# Patient Record
Sex: Female | Born: 1985 | Hispanic: Yes | Marital: Single | Smoking: Former smoker
Health system: Southern US, Community
[De-identification: ages and names within clinical notes are randomized; demographics above are authoritative.]

## PROBLEM LIST (undated history)

## (undated) DIAGNOSIS — M419 Scoliosis, unspecified: Secondary | ICD-10-CM

## (undated) HISTORY — PX: CHOLECYSTECTOMY: SHX55

---

## 2013-12-09 ENCOUNTER — Emergency Department: Payer: Self-pay | Admitting: Emergency Medicine

## 2015-11-09 ENCOUNTER — Emergency Department: Payer: No Typology Code available for payment source

## 2015-11-09 ENCOUNTER — Encounter: Payer: Self-pay | Admitting: Emergency Medicine

## 2015-11-09 ENCOUNTER — Emergency Department
Admission: EM | Admit: 2015-11-09 | Discharge: 2015-11-10 | Disposition: A | Discharge status: Home / Self Care | Payer: No Typology Code available for payment source | Attending: Emergency Medicine | Admitting: Emergency Medicine

## 2015-11-09 DIAGNOSIS — Z3202 Encounter for pregnancy test, result negative: Secondary | ICD-10-CM | POA: Diagnosis not present

## 2015-11-09 DIAGNOSIS — F17200 Nicotine dependence, unspecified, uncomplicated: Secondary | ICD-10-CM | POA: Insufficient documentation

## 2015-11-09 DIAGNOSIS — S161XXA Strain of muscle, fascia and tendon at neck level, initial encounter: Secondary | ICD-10-CM | POA: Insufficient documentation

## 2015-11-09 DIAGNOSIS — Y9389 Activity, other specified: Secondary | ICD-10-CM | POA: Diagnosis not present

## 2015-11-09 DIAGNOSIS — S39012A Strain of muscle, fascia and tendon of lower back, initial encounter: Secondary | ICD-10-CM

## 2015-11-09 DIAGNOSIS — Y92410 Unspecified street and highway as the place of occurrence of the external cause: Secondary | ICD-10-CM | POA: Diagnosis not present

## 2015-11-09 DIAGNOSIS — Y998 Other external cause status: Secondary | ICD-10-CM | POA: Diagnosis not present

## 2015-11-09 DIAGNOSIS — S199XXA Unspecified injury of neck, initial encounter: Secondary | ICD-10-CM | POA: Diagnosis present

## 2015-11-09 HISTORY — DX: Scoliosis, unspecified: M41.9

## 2015-11-09 LAB — POCT PREGNANCY, URINE: PREG TEST UR: NEGATIVE

## 2015-11-09 MED ORDER — OXYCODONE-ACETAMINOPHEN 7.5-325 MG PO TABS: 1.0000 | ORAL_TABLET | ORAL | Status: DC | PRN

## 2015-11-09 MED ORDER — TRAMADOL HCL 50 MG PO TABS: 50.0000 mg | ORAL_TABLET | Freq: Four times a day (QID) | ORAL | Status: DC | PRN

## 2015-11-09 MED ORDER — METHOCARBAMOL 750 MG PO TABS: 750.0000 mg | ORAL_TABLET | Freq: Four times a day (QID) | ORAL | Status: DC

## 2015-11-09 MED ORDER — CYCLOBENZAPRINE HCL 10 MG PO TABS: 10.0000 mg | ORAL_TABLET | Freq: Three times a day (TID) | ORAL | Status: DC | PRN

## 2015-11-09 MED ORDER — TRAMADOL HCL 50 MG PO TABS
50.0000 mg | ORAL_TABLET | Freq: Once | ORAL | Status: AC
  Administered 2015-11-09: 50 mg via ORAL
  Filled 2015-11-09: qty 1

## 2015-11-09 MED ORDER — IBUPROFEN 800 MG PO TABS: 800.0000 mg | ORAL_TABLET | Freq: Three times a day (TID) | ORAL | Status: DC | PRN

## 2015-11-09 MED ORDER — METHOCARBAMOL 500 MG PO TABS
1000.0000 mg | ORAL_TABLET | Freq: Once | ORAL | Status: AC
  Administered 2015-11-09: 1000 mg via ORAL
  Filled 2015-11-09: qty 2

## 2015-11-09 MED ORDER — IBUPROFEN 600 MG PO TABS: 600.0000 mg | ORAL_TABLET | Freq: Four times a day (QID) | ORAL | Status: DC | PRN
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
## 2015-11-09 NOTE — Discharge Instructions (Signed)

## 2015-11-10 MED ORDER — HYDROMORPHONE HCL 1 MG/ML IJ SOLN
INTRAMUSCULAR | Status: AC
  Filled 2015-11-10: qty 1

## 2015-11-10 MED ORDER — HYDROMORPHONE HCL 1 MG/ML IJ SOLN
1.0000 mg | Freq: Once | INTRAMUSCULAR | Status: AC
  Administered 2015-11-10: 1 mg via INTRAMUSCULAR

## 2015-12-07 ENCOUNTER — Encounter: Payer: Self-pay | Admitting: Emergency Medicine

## 2015-12-07 ENCOUNTER — Emergency Department
Admission: EM | Admit: 2015-12-07 | Discharge: 2015-12-07 | Disposition: A | Discharge status: Home / Self Care | Payer: Self-pay | Attending: Student | Admitting: Student

## 2015-12-07 ENCOUNTER — Emergency Department: Payer: Self-pay

## 2015-12-07 DIAGNOSIS — M419 Scoliosis, unspecified: Secondary | ICD-10-CM | POA: Insufficient documentation

## 2015-12-07 DIAGNOSIS — K529 Noninfective gastroenteritis and colitis, unspecified: Secondary | ICD-10-CM

## 2015-12-07 DIAGNOSIS — K5180 Other ulcerative colitis without complications: Secondary | ICD-10-CM | POA: Insufficient documentation

## 2015-12-07 DIAGNOSIS — Z720 Tobacco use: Secondary | ICD-10-CM | POA: Insufficient documentation

## 2015-12-07 DIAGNOSIS — R109 Unspecified abdominal pain: Secondary | ICD-10-CM

## 2015-12-07 LAB — COMPREHENSIVE METABOLIC PANEL
ALBUMIN: 3.8 g/dL (ref 3.5–5.0)
ALK PHOS: 66 U/L (ref 38–126)
ALT: 12 U/L — ABNORMAL LOW (ref 14–54)
AST: 14 U/L — AB (ref 15–41)
Anion gap: 7 (ref 5–15)
BILIRUBIN TOTAL: 0.5 mg/dL (ref 0.3–1.2)
BUN: 7 mg/dL (ref 6–20)
CALCIUM: 8.8 mg/dL — AB (ref 8.9–10.3)
CO2: 27 mmol/L (ref 22–32)
CREATININE: 0.77 mg/dL (ref 0.44–1.00)
Chloride: 100 mmol/L — ABNORMAL LOW (ref 101–111)
GFR calc Af Amer: >60 mL/min (ref >60)
GFR calc non Af Amer: >60 mL/min (ref >60)
GLUCOSE: 107 mg/dL — AB (ref 65–99)
Potassium: 3.8 mmol/L (ref 3.5–5.1)
Sodium: 134 mmol/L — ABNORMAL LOW (ref 135–145)
TOTAL PROTEIN: 7.7 g/dL (ref 6.5–8.1)

## 2015-12-07 LAB — CBC
HCT: 40.5 % (ref 35.0–47.0)
Hemoglobin: 13.5 g/dL (ref 12.0–16.0)
MCH: 30.1 pg (ref 26.0–34.0)
MCHC: 33.3 g/dL (ref 32.0–36.0)
MCV: 90.3 fL (ref 80.0–100.0)
PLATELETS: 287 10*3/uL (ref 150–440)
RBC: 4.49 MIL/uL (ref 3.80–5.20)
RDW: 12.8 % (ref 11.5–14.5)
WBC: 13.3 10*3/uL — ABNORMAL HIGH (ref 3.6–11.0)

## 2015-12-07 LAB — URINALYSIS COMPLETE WITH MICROSCOPIC (ARMC ONLY)
BACTERIA UA: NONE SEEN
BILIRUBIN URINE: NEGATIVE
GLUCOSE, UA: NEGATIVE mg/dL
Hgb urine dipstick: NEGATIVE
KETONES UR: NEGATIVE mg/dL
Leukocytes, UA: NEGATIVE
NITRITE: NEGATIVE
Protein, ur: NEGATIVE mg/dL
SPECIFIC GRAVITY, URINE: 1.018 (ref 1.005–1.030)
pH: 6 (ref 5.0–8.0)

## 2015-12-07 LAB — LIPASE, BLOOD: Lipase: 15 U/L (ref 11–51)

## 2015-12-07 LAB — PREGNANCY, URINE: PREG TEST UR: NEGATIVE

## 2015-12-07 MED ORDER — HYDROMORPHONE HCL 1 MG/ML IJ SOLN
1.0000 mg | Freq: Once | INTRAMUSCULAR | Status: AC
  Administered 2015-12-07: 1 mg via INTRAVENOUS

## 2015-12-07 MED ORDER — METRONIDAZOLE 500 MG PO TABS: 500.0000 mg | ORAL_TABLET | Freq: Three times a day (TID) | ORAL | Status: DC

## 2015-12-07 MED ORDER — ONDANSETRON 4 MG PO TBDP: 4.0000 mg | ORAL_TABLET | Freq: Three times a day (TID) | ORAL | Status: DC | PRN

## 2015-12-07 MED ORDER — OXYCODONE HCL 5 MG PO TABS
5.0000 mg | ORAL_TABLET | Freq: Once | ORAL | Status: AC
  Administered 2015-12-07: 5 mg via ORAL
  Filled 2015-12-07: qty 1

## 2015-12-07 MED ORDER — FLUCONAZOLE 150 MG PO TABS: 150.0000 mg | ORAL_TABLET | Freq: Once | ORAL | Status: AC

## 2015-12-07 MED ORDER — MORPHINE SULFATE (PF) 4 MG/ML IV SOLN
4.0000 mg | Freq: Once | INTRAVENOUS | Status: AC
  Administered 2015-12-07: 4 mg via INTRAVENOUS

## 2015-12-07 MED ORDER — HYDROMORPHONE HCL 1 MG/ML IJ SOLN
INTRAMUSCULAR | Status: AC
  Administered 2015-12-07: 1 mg via INTRAVENOUS
  Filled 2015-12-07: qty 1

## 2015-12-07 MED ORDER — SODIUM CHLORIDE 0.9 % IV BOLUS (SEPSIS)
1000.0000 mL | Freq: Once | INTRAVENOUS | Status: AC
  Administered 2015-12-07: 1000 mL via INTRAVENOUS

## 2015-12-07 MED ORDER — ONDANSETRON HCL 4 MG/2ML IJ SOLN
4.0000 mg | Freq: Once | INTRAMUSCULAR | Status: AC
  Administered 2015-12-07: 4 mg via INTRAVENOUS

## 2015-12-07 MED ORDER — IOHEXOL 240 MG/ML SOLN
25.0000 mL | Freq: Once | INTRAMUSCULAR | Status: AC | PRN
  Administered 2015-12-07: 25 mL via ORAL

## 2015-12-07 MED ORDER — IBUPROFEN 600 MG PO TABS: 600.0000 mg | ORAL_TABLET | Freq: Three times a day (TID) | ORAL | Status: DC | PRN

## 2015-12-07 MED ORDER — IOHEXOL 300 MG/ML  SOLN
100.0000 mL | Freq: Once | INTRAMUSCULAR | Status: AC | PRN
  Administered 2015-12-07: 100 mL via INTRAVENOUS

## 2015-12-07 MED ORDER — AMOXICILLIN-POT CLAVULANATE 875-125 MG PO TABS: 1.0000 | ORAL_TABLET | Freq: Two times a day (BID) | ORAL | Status: AC

## 2015-12-07 MED ORDER — ONDANSETRON HCL 4 MG/2ML IJ SOLN
INTRAMUSCULAR | Status: AC
  Administered 2015-12-07: 4 mg via INTRAVENOUS
  Filled 2015-12-07: qty 2

## 2015-12-07 MED ORDER — MORPHINE SULFATE (PF) 4 MG/ML IV SOLN
INTRAVENOUS | Status: AC
  Administered 2015-12-07: 4 mg via INTRAVENOUS
  Filled 2015-12-07: qty 1

## 2015-12-07 NOTE — ED Notes (Signed)
Patient calling out to report that pain is back; 10/10 at present. MD made aware. VORB for Dilaudid 1mg  IVP now. Order to be entered and carried by this RN. Patient has almost completed the PO contrast volume. Will continue to monitor.

## 2015-12-07 NOTE — ED Notes (Signed)
Patient returned to room from CT; reports that pain continues when supine or coughs. Patient positioned sitting upright in bed and reports that pain is well controlled as long as she "doesnt move". MD aware. Awaiting CT results at this time. Will continue to monitor.

## 2015-12-07 NOTE — ED Notes (Signed)
Patient to CT at this time

## 2015-12-07 NOTE — Discharge Instructions (Signed)
Return immediately to the emergency department if you develop severe or worsening abdominal pain, recurrent vomiting, blood in vomit or stools, inability to have a bowel movement, fevers, chest pain, difficulty breathing or for any other concerns. Follow up with a primary care doctor as soon as possible. Follow up with OB/GYN soon as possible for evaluation of possible pelvic congestion syndrome.

## 2015-12-07 NOTE — ED Notes (Addendum)
Patient discharge and follow up information reviewed with patient by ED nursing staff and patient given the opportunity to ask questions pertaining to ED visit and discharge plan of care. Patient advised that should symptoms not continue to improve, resolve entirely, or should new symptoms develop then a follow up visit with their PCP or a return visit to the ED may be warranted. Patient verbalized consent and understanding of discharge plan of care including potential need for further evaluation. Patient being discharged in stable condition per attending ED physician on duty. Patient asking for pain medication and a Rx for Diflucan prior to discharge; MD to oblige request.

## 2015-12-07 NOTE — ED Provider Notes (Signed)
[facility] Emergency Department Provider Note  ____________________________________________  Time seen: Approximately 7:55 PM  I have reviewed the triage vital signs and the nursing notes.   HISTORY  Chief Complaint Abdominal Pain    HPI Liylah Mariadelosang Wynns is a 30 y.o. female with history of scoliosis, history of cholecystectomy 2009 who presents for evaluation of 3 days of right-sided abdominal pain, gradual onset, constant since onset, now severe, no modifying factors. She has had nausea but no vomiting. No diarrhea. She feels constipated but she did have to small bowel movements today. No fevers. No chest pain or difficulty breathing. No dysuria or hematuria.   Past Medical History  Diagnosis Date  . Scoliosis     S curve    There are no active problems to display for this patient.   Past Surgical History  Procedure Laterality Date  . Cholecystectomy      2009    Current Outpatient Rx  Name  Route  Sig  Dispense  Refill  . cyclobenzaprine (FLEXERIL) 10 MG tablet   Oral   Take 1 tablet (10 mg total) by mouth every 8 (eight) hours as needed for muscle spasms.   15 tablet   0   . ibuprofen (ADVIL,MOTRIN) 200 MG tablet   Oral   Take 600-800 mg by mouth every 6 (six) hours as needed for headache or mild pain.         Marland Kitchen ibuprofen (ADVIL,MOTRIN) 600 MG tablet   Oral   Take 1 tablet (600 mg total) by mouth every 6 (six) hours as needed. Patient not taking: Reported on 12/07/2015   30 tablet   0   . ibuprofen (ADVIL,MOTRIN) 800 MG tablet   Oral   Take 1 tablet (800 mg total) by mouth every 8 (eight) hours as needed. Patient not taking: Reported on 12/07/2015   30 tablet   0   . oxyCODONE-acetaminophen (PERCOCET) 7.5-325 MG tablet   Oral   Take 1 tablet by mouth every 4 (four) hours as needed for severe pain. Patient not taking: Reported on 12/07/2015   20 tablet   0     Allergies Sulfa antibiotics  No family history on  file.  Social History Social History  Substance Use Topics  . Smoking status: Light Tobacco Smoker  . Smokeless tobacco: None  . Alcohol Use: Yes     Comment: social     Review of Systems Constitutional: No fever/chills Eyes: No visual changes. ENT: No sore throat. Cardiovascular: Denies chest pain. Respiratory: Denies shortness of breath. Gastrointestinal: +abdominal pain.  + nausea, no vomiting.  No diarrhea.  + constipation. Genitourinary: Negative for dysuria. Musculoskeletal: Negative for back pain. Skin: Negative for rash. Neurological: Negative for headaches, focal weakness or numbness.  10-point ROS otherwise negative.  ____________________________________________   PHYSICAL EXAM:  VITAL SIGNS: ED Triage Vitals  Enc Vitals Group     BP 12/07/15 1759 115/80 mmHg     Pulse Rate 12/07/15 1759 98     Resp 12/07/15 1759 16     Temp 12/07/15 1759 98.7 F (37.1 C)     Temp Source 12/07/15 1759 Oral     SpO2 12/07/15 1759 99 %     Weight 12/07/15 1759 190 lb (86.183 kg)     Height 12/07/15 1759  (1.626 m)     Head Cir --      Peak Flow --      Pain Score 12/07/15 1800 7  Pain Loc --      Pain Edu? --      Excl. in GC? --     Constitutional: Alert and oriented. In distress secondary to pain. Eyes: Conjunctivae are normal. PERRL. EOMI. Head: Atraumatic. Nose: No congestion/rhinnorhea. Mouth/Throat: Mucous membranes are moist.  Oropharynx non-erythematous. Neck: No stridor. Able without meningismus. Cardiovascular: Normal rate, regular rhythm. Grossly normal heart sounds.  Good peripheral circulation. Respiratory: Normal respiratory effort.  No retractions. Lungs CTAB. Gastrointestinal: Soft tenderness to palpation throughout the right abdomen No CVA tenderness. Genitourinary: Deferred Musculoskeletal: No lower extremity tenderness nor edema.  No joint effusions. Neurologic:  Normal speech and language. No gross focal neurologic deficits are  appreciated. No gait instability. Skin:  Skin is warm, dry and intact. No rash noted. Psychiatric: Mood and affect are normal. Speech and behavior are normal.  ____________________________________________   LABS (all labs ordered are listed, but only abnormal results are displayed)  Labs Reviewed  COMPREHENSIVE METABOLIC PANEL - Abnormal; Notable for the following:    Sodium 134 (*)    Chloride 100 (*)    Glucose, Bld 107 (*)    Calcium 8.8 (*)    AST 14 (*)    ALT 12 (*)    All other components within normal limits  CBC - Abnormal; Notable for the following:    WBC 13.3 (*)    All other components within normal limits  URINALYSIS COMPLETEWITH MICROSCOPIC ([facility] ONLY) - Abnormal; Notable for the following:    Color, Urine YELLOW (*)    APPearance CLEAR (*)    Squamous Epithelial / LPF 0-5 (*)    All other components within normal limits  LIPASE, BLOOD  PREGNANCY, URINE   ____________________________________________  EKG  ED ECG REPORT I, Gayla DossGayle, Sagan Wurzel A, the attending physician, personally viewed and interpreted this ECG.   Date: 12/07/2015  EKG Time: 20:01  Rate: 88  Rhythm: normal sinus rhythm  Axis: normal  Intervals:none  ST&T Change: No acute ST elevation. Borderline short PR interval.  ____________________________________________  RADIOLOGY  CT abdomen and pelvis IMPRESSION: 1. There are 2 potential etiologies for right-sided pain. Equivocal colonic wall thickening involving the ascending colon with minimal wall liquid stool. Findings may reflect minimal right-sided colitis. Alternatively, there is prominent right adnexal vascularity and dilatation of the ovarian vein, which can be seen with pelvic congestion syndrome. 2. Normal appendix. Postcholecystectomy with biliary prominence, likely normal postsurgical.  ____________________________________________   PROCEDURES  Procedure(s) performed: None  Critical Care performed:  No  ____________________________________________   INITIAL IMPRESSION / ASSESSMENT AND PLAN / ED COURSE  Pertinent labs & imaging results that were available during my care of the patient were reviewed by me and considered in my medical decision making (see chart for details).  Damian Leavellrudy Kallie EdwardGarcia Travers is a 30 y.o. female with history of scoliosis, history of cholecystectomy 2009 who presents for evaluation of 3 days of right-sided abdominal pain. On exam, she is in significant distress second to pain and has diminished tenderness all throughout the right abdomen. Labs reviewed. CMP and lipase are unremarkable. Urinalysis is not consistent with urinary tract infection. Negative urine pregnancy test. CBC is notable for leukocytosis and given her right-sided abdominal pain with tenderness, plan for CT of the abdomen and pelvis to rule out appendicitis. We'll treat her pain. Reassess for disposition.  ----------------------------------------- 10:31 PM on 12/07/2015 ----------------------------------------- CT of the abdomen and pelvis with possible right-sided colitis versus possible pelvic congestion syndrome. I discussed this with the patient. As she  is also having GI complaints including nausea and some constipation, we discussed that we would treat her with antibiotics to include Flagyl and Augmentin. We also discussed the possibility of pelvic congestion syndrome, she reports to me that she has been having some painful intercourse recently. I discussed return precautions, need for close PCP as well as OB/GYN follow-up and she is comfortable with the discharge plan. DC home.  ____________________________________________   FINAL CLINICAL IMPRESSION(S) / ED DIAGNOSES  Final diagnoses:  Abdominal pain, unspecified abdominal location  Colitis      Gayla Doss, MD 12/07/15 2233

## 2015-12-07 NOTE — ED Notes (Signed)
PO contrast at bedside. Patient instructed to consume volume and notify RN when complete. Denies N/V at present; premedicated with zofran

## 2015-12-07 NOTE — ED Notes (Signed)
Dr. Inocencio HomesGayle in to see and assess patient at this time.

## 2015-12-07 NOTE — ED Notes (Signed)
Patient calling out to report recurrent nausea. MD with VORB for Zofran 4mg  IVP; order to be entered and carried by this RN.

## 2015-12-07 NOTE — ED Notes (Signed)
Abdominal pain x 3 days.  Abdominal distention.  Constipation.  C/O pain to right mid abdomen.  Last BM today, but small amount.  Decreased appetite.  Denies vomiting.  Nausea small amount.

## 2016-02-18 ENCOUNTER — Encounter: Payer: Self-pay | Admitting: Physician Assistant

## 2016-02-18 ENCOUNTER — Ambulatory Visit: Payer: Self-pay | Admitting: Physician Assistant

## 2016-02-18 VITALS — BP 120/80 | HR 82 | Temp 98.4°F

## 2016-02-18 DIAGNOSIS — R109 Unspecified abdominal pain: Secondary | ICD-10-CM

## 2016-02-18 DIAGNOSIS — J00 Acute nasopharyngitis [common cold]: Secondary | ICD-10-CM

## 2016-02-18 LAB — POCT URINALYSIS DIPSTICK
BILIRUBIN UA: NEGATIVE
GLUCOSE UA: NEGATIVE
Ketones, UA: NEGATIVE
Leukocytes, UA: NEGATIVE
NITRITE UA: NEGATIVE
Protein, UA: NEGATIVE
RBC UA: NEGATIVE
Spec Grav, UA: >=1.03
Urobilinogen, UA: 0.2
pH, UA: 7

## 2016-02-18 NOTE — Progress Notes (Signed)
S: C/o runny nose and congestion for 1-2 days, no fever, chills, cp/sob, v/d;  cough is sporadic, son has same sx, also having lower pelvic pain, no vag discharge, no bleeding, no uti sx, has iud  Using otc meds: none  O: PE: vitals wnl, nad, pt appears well, perrl eomi, normocephalic, tms dull, nasal mucosa red and swollen, throat injected, neck supple no lymph, lungs c t a, cv rrr, neuro intact, ua wnl  A:  Acute viral uri, pelvic pain   P: reassurance, go to gyn or ER for abd pain, drink fluids, continue regular meds , use otc meds of choice, return if not improving in 5 days, return earlier if worsening

## 2016-06-30 ENCOUNTER — Ambulatory Visit: Payer: Self-pay | Admitting: Physician Assistant

## 2016-06-30 ENCOUNTER — Encounter: Payer: Self-pay | Admitting: Physician Assistant

## 2016-06-30 VITALS — BP 124/80 | HR 60 | Temp 98.4°F

## 2016-06-30 DIAGNOSIS — J0100 Acute maxillary sinusitis, unspecified: Secondary | ICD-10-CM

## 2016-06-30 MED ORDER — AMOXICILLIN 875 MG PO TABS: 875.0000 mg | ORAL_TABLET | Freq: Two times a day (BID) | ORAL | 0 refills | Status: DC

## 2016-06-30 MED ORDER — FLUTICASONE PROPIONATE 50 MCG/ACT NA SUSP: 2.0000 | Freq: Every day | NASAL | 6 refills | Status: DC

## 2016-06-30 MED ORDER — FLUCONAZOLE 150 MG PO TABS: ORAL_TABLET | ORAL | 0 refills | Status: DC

## 2016-06-30 NOTE — Progress Notes (Signed)
S: C/o runny nose and congestion for 1 days, no fever, chills, cp/sob, v/d; mucus is yellow and thick, cough is sporadic, c/o of facial and dental pain. + sore throat  Using otc meds:   O: PE: vitals wnl, nad, perrl eomi, normocephalic, tms dull, nasal mucosa red and swollen, throat injected, neck supple no lymph, lungs c t a, cv rrr, neuro intact  A:  Acute sinusitis   P: drink fluids, continue regular meds , use otc meds of choice, return if not improving in 5 days, return earlier if worsening , amoxil, diflucan, flonase

## 2017-07-10 ENCOUNTER — Emergency Department
Admission: EM | Admit: 2017-07-10 | Discharge: 2017-07-10 | Disposition: A | Discharge status: Home / Self Care | Payer: Self-pay | Attending: Emergency Medicine | Admitting: Emergency Medicine

## 2017-07-10 DIAGNOSIS — Z9049 Acquired absence of other specified parts of digestive tract: Secondary | ICD-10-CM | POA: Insufficient documentation

## 2017-07-10 DIAGNOSIS — J020 Streptococcal pharyngitis: Secondary | ICD-10-CM | POA: Insufficient documentation

## 2017-07-10 DIAGNOSIS — Z79899 Other long term (current) drug therapy: Secondary | ICD-10-CM | POA: Insufficient documentation

## 2017-07-10 DIAGNOSIS — F17200 Nicotine dependence, unspecified, uncomplicated: Secondary | ICD-10-CM | POA: Insufficient documentation

## 2017-07-10 LAB — POCT RAPID STREP A: Streptococcus, Group A Screen (Direct): POSITIVE — AB

## 2017-07-10 MED ORDER — MAGIC MOUTHWASH W/LIDOCAINE: 5.0000 mL | Freq: Four times a day (QID) | ORAL | 0 refills | Status: DC

## 2017-07-10 MED ORDER — ACETAMINOPHEN 325 MG PO TABS
650.0000 mg | ORAL_TABLET | Freq: Once | ORAL | Status: AC | PRN
  Administered 2017-07-10: 650 mg via ORAL
  Filled 2017-07-10: qty 2

## 2017-07-10 MED ORDER — FLUTICASONE PROPIONATE 50 MCG/ACT NA SUSP: 2.0000 | Freq: Every day | NASAL | 0 refills | Status: AC

## 2017-07-10 MED ORDER — DEXAMETHASONE SODIUM PHOSPHATE 10 MG/ML IJ SOLN
10.0000 mg | Freq: Once | INTRAMUSCULAR | Status: AC
  Administered 2017-07-10: 10 mg via INTRAMUSCULAR
  Filled 2017-07-10: qty 1

## 2017-07-10 MED ORDER — PENICILLIN V POTASSIUM 500 MG PO TABS
500.0000 mg | ORAL_TABLET | Freq: Once | ORAL | Status: AC
  Administered 2017-07-10: 500 mg via ORAL
  Filled 2017-07-10: qty 1

## 2017-07-10 MED ORDER — LIDOCAINE VISCOUS 2 % MT SOLN
15.0000 mL | Freq: Once | OROMUCOSAL | Status: AC
  Administered 2017-07-10: 15 mL via OROMUCOSAL
  Filled 2017-07-10: qty 15

## 2017-07-10 MED ORDER — FLUCONAZOLE 100 MG PO TABS: 150.0000 mg | ORAL_TABLET | Freq: Once | ORAL | 0 refills | Status: AC

## 2017-07-10 MED ORDER — PENICILLIN V POTASSIUM 500 MG PO TABS: 500.0000 mg | ORAL_TABLET | Freq: Three times a day (TID) | ORAL | 0 refills | Status: AC

## 2017-07-10 NOTE — ED Provider Notes (Signed)
[facility] Emergency Department Provider Note  ____________________________________________  Time seen: Approximately 8:12 AM  I have reviewed the triage vital signs and the nursing notes.   HISTORY  Chief Complaint Sore Throat and Otalgia    HPI Katie Vazquez is a 31 y.o. female that presents to the emergency department with sore throat for 1 day. Pain radiates to her left ear. She had a little bit of congestion and a slight cough this morning. Patient gets strep frequently. She took a dose of leftover Cefdinir last night. When she takes antibodies, she gets a yeast infection. Son has an ear infection currently. She works at a hotel handing out keys. No fever, chills, shortness breath, chest pain, nausea, vomiting, abdominal pain.   Past Medical History:  Diagnosis Date  . Scoliosis    S curve    There are no active problems to display for this patient.   Past Surgical History:  Procedure Laterality Date  . CHOLECYSTECTOMY     2009    Prior to Admission medications   Medication Sig Start Date End Date Taking? Authorizing Provider  amoxicillin (AMOXIL) 875 MG tablet Take 1 tablet (875 mg total) by mouth 2 (two) times daily. 06/30/16   Fisher, Roselyn Bering, PA-C  fluconazole (DIFLUCAN) 100 MG tablet Take 1.5 tablets (150 mg total) by mouth once. 07/10/17 07/10/17  Enid Derry, PA-C  fluticasone (FLONASE) 50 MCG/ACT nasal spray Place 2 sprays into both nostrils daily. 07/10/17 07/10/18  Enid Derry, PA-C  levonorgestrel (MIRENA) 20 MCG/24HR IUD 1 each by Intrauterine route once.    [provider]  magic mouthwash w/lidocaine SOLN Take 5 mLs by mouth 4 (four) times daily. 07/10/17   Enid Derry, PA-C  penicillin v potassium (VEETID) 500 MG tablet Take 1 tablet (500 mg total) by mouth 3 (three) times daily. 07/10/17 07/20/17  Enid Derry, PA-C    Allergies Sulfa antibiotics  No family history on file.  Social History Social  History  Substance Use Topics  . Smoking status: Light Tobacco Smoker  . Smokeless tobacco: Not on file  . Alcohol use Yes     Comment: social      Review of Systems  Constitutional: No fever/chills Eyes: No visual changes. No discharge. ENT: Positive for congestion. Cardiovascular: No chest pain. Respiratory: Positive for cough. No SOB. Gastrointestinal: No abdominal pain.  No nausea, no vomiting.  No diarrhea.  No constipation. Musculoskeletal: Negative for musculoskeletal pain. Skin: Negative for rash, abrasions, lacerations, ecchymosis. Neurological: Negative for headaches.   ____________________________________________   PHYSICAL EXAM:  VITAL SIGNS: ED Triage Vitals  Enc Vitals Group     BP 07/10/17 0741 123/87     Pulse Rate 07/10/17 0741 96     Resp 07/10/17 0741 18     Temp 07/10/17 0741 (!) 100.6 F (38.1 C)     Temp Source 07/10/17 0741 Oral     SpO2 07/10/17 0741 100 %     Weight 07/10/17 0741 208 lb (94.3 kg)     Height 07/10/17 0741  (1.6 m)     Head Circumference --      Peak Flow --      Pain Score 07/10/17 0754 8     Pain Loc --      Pain Edu? --      Excl. in GC? --      Constitutional: Alert and oriented. Well appearing and in no acute distress. Eyes: Conjunctivae are normal. PERRL. EOMI. No discharge. Head:  Atraumatic. ENT: No frontal and maxillary sinus tenderness.      Ears: Tympanic membranes pearly gray with good landmarks. No discharge.      Nose: Mild congestion/rhinnorhea.      Mouth/Throat: Mucous membranes are moist. Oropharynx erythematous. Tonsils enlarged bilaterally with exudates. Uvula midline. Neck: No stridor.   Hematological/Lymphatic/Immunilogical: No cervical lymphadenopathy. Cardiovascular: Normal rate, regular rhythm.  Good peripheral circulation. Respiratory: Normal respiratory effort without tachypnea or retractions. Lungs CTAB. Good air entry to the bases with no decreased or absent breath  sounds. Gastrointestinal: Bowel sounds 4 quadrants. Soft and nontender to palpation. No guarding or rigidity. No palpable masses. No distention. Musculoskeletal: Full range of motion to all extremities. No gross deformities appreciated. Neurologic:  Normal speech and language. No gross focal neurologic deficits are appreciated.  Skin:  Skin is warm, dry and intact. No rash noted.   ____________________________________________   LABS (all labs ordered are listed, but only abnormal results are displayed)  Labs Reviewed  POCT RAPID STREP A - Abnormal; Notable for the following:       Result Value   Streptococcus, Group A Screen (Direct) POSITIVE (*)    All other components within normal limits   ____________________________________________  EKG   ____________________________________________  RADIOLOGY   No results found.  ____________________________________________    PROCEDURES  Procedure(s) performed:    Procedures    Medications  acetaminophen (TYLENOL) tablet 650 mg (650 mg Oral Given 07/10/17 0758)  dexamethasone (DECADRON) injection 10 mg (10 mg Intramuscular Given 07/10/17 0842)  lidocaine (XYLOCAINE) 2 % viscous mouth solution 15 mL (15 mLs Mouth/Throat Given 07/10/17 0843)  penicillin v potassium (VEETID) tablet 500 mg (500 mg Oral Given 07/10/17 0842)     ____________________________________________   INITIAL IMPRESSION / ASSESSMENT AND PLAN / ED COURSE  Pertinent labs & imaging results that were available during my care of the patient were reviewed by me and considered in my medical decision making (see chart for details).  Review of the [place] CSRS was performed in accordance of the [facility] prior to dispensing any controlled drugs.   Patient's diagnosis is consistent with strep pharyngitis. Vital signs and exam are reassuring. Patient appears well and is staying well hydrated. She was given IM Decadron, a dose of penicillin, and viscous lidocaine in  ED. Patient feels comfortable going home. Patient will be discharged home with prescriptions for penicillin, viscous lidocaine, Flonase. She'll also be given a prescription for Diflucan prophylactic for yeast. Patient is to follow up with PCP as needed or otherwise directed. Patient is given ED precautions to return to the ED for any worsening or new symptoms.     ____________________________________________  FINAL CLINICAL IMPRESSION(S) / ED DIAGNOSES  Final diagnoses:  Strep pharyngitis      NEW MEDICATIONS STARTED DURING THIS VISIT:  Discharge Medication List as of 07/10/2017  8:36 AM    START taking these medications   Details  magic mouthwash w/lidocaine SOLN Take 5 mLs by mouth 4 (four) times daily., Starting Fri 07/10/2017, Print    penicillin v potassium (VEETID) 500 MG tablet Take 1 tablet (500 mg total) by mouth 3 (three) times daily., Starting Fri 07/10/2017, Until Mon 07/20/2017, Print            This chart was dictated using voice recognition software/Dragon. Despite best efforts to proofread, errors can occur which can change the meaning. Any change was purely unintentional.    Enid Derry, PA-C 07/10/17 0941    Arnaldo Natal, MD 07/10/17 [phone]

## 2017-07-10 NOTE — Discharge Instructions (Signed)
OPTIONS FOR DENTAL FOLLOW UP CARE ° °[place] Department of Health and Human Services - Local Safety Net Dental Clinics °http://www.ncdhhs.gov/dph/oralhealth/services/safetynetclinics.htm °  °[facility] (336-562-3123) ° °[facility] [place] (919-933-9087) ° °[facility] (919-663-1744 ext [phone]) ° °[facility] ([phone]) ° °[facility] ([phone]) °This clinic caters to the indigent population and is on a lottery system. °Location: °[facility], [address], [place] °Clinic Hours: °Wednesdays from 6pm - 9pm, patients seen by a lottery system. °For dates, call or go to [email or web address].[facility].edu/[facility]/patients/Dental-[facility] °Services: °Cleanings, fillings and simple extractions. °Payment Options: °DENTAL WORK IS FREE OF CHARGE. Bring proof of income or support. °Best way to get seen: °Arrive at 5:15 pm - this is a lottery, NOT first come/first serve, so arriving earlier will not increase your chances of being seen. °  °  °[facility] °919-537-3737 °Select option 1 for emergencies °  °Location: °[facility], [address], [place] °Clinic Hours: °No walk-ins accepted - call the day before to schedule an appointment. °Check in times are 9:30 am and 1:30 pm. °Services: °Simple extractions, temporary fillings, pulpectomy/pulp debridement, uncomplicated abscess drainage. °Payment Options: °PAYMENT IS DUE AT THE TIME OF SERVICE.  Fee is usually $100-200, additional surgical procedures (e.g. abscess drainage) may be extra. °Cash, checks, Visa/MasterCard accepted.  Can file Medicaid if patient is covered for dental - patient should call case worker to check. °No discount for [facility] patients. °Best way to get seen: °MUST call the day before and get onto the schedule. Can usually be seen the next 1-2 days. No walk-ins accepted. °  °  °[facility] °[phone] °   °Location: °[facility], [address], [place] °Clinic Hours: °M, W, Th, F 8am or 1:30pm, Tues 9a or 1:30 - first come/first served. °Services: °Simple extractions, temporary fillings, uncomplicated abscess drainage.  You do not need to be an [place] resident. °Payment Options: °PAYMENT IS DUE AT THE TIME OF SERVICE. °Dental insurance, otherwise sliding scale - bring proof of income or support. °Depending on income and treatment needed, cost is usually $50-200. °Best way to get seen: °Arrive early as it is first come/first served. °  °  °[facility] °[phone] °  °Location: °[address] °Clinic Hours: °Mon-Thu 8a-5p °Services: °Most basic dental services including extractions and fillings. °Payment Options: °PAYMENT IS DUE AT THE TIME OF SERVICE. °Sliding scale, up to 50% off - bring proof if income or support. °Medicaid with dental option accepted. °Best way to get seen: °Call to schedule an appointment, can usually be seen within 2 weeks OR they will try to see walk-ins - show up at 8a or 2p (you may have to wait). °  °  °[facility] °919-245-2435 °[place] RESIDENTS ONLY °  °Location: °[facility], [address], [place], [place] [address] °Clinic Hours: By appointment only. °Monday - Thursday 8am-5pm, Friday 8am-12pm °Services: Cleanings, fillings, extractions. °Payment Options: °PAYMENT IS DUE AT THE TIME OF SERVICE. °Cash, Visa or MasterCard. Sliding scale - $30 minimum per service. °Best way to get seen: °Come in to office, complete packet and make an appointment - need proof of income °or support monies for each household member and proof of [place] residence. °Usually takes about a month to get in. °  °  °[facility] °[phone] °  °Location: °[address]   [place] °Clinic Hours: Walk-in Urgent Care Dental Services are offered Monday-Friday  mornings only. °The numbers of emergencies accepted daily is limited to the number of °providers available. °Maximum 15 - Mondays, Wednesdays & Thursdays °Maximum 10 - Tuesdays & Fridays °Services: °You do not need to be a [place] resident to be seen for a dental emergency. °Emergencies are defined as pain, swelling, abnormal bleeding, or dental trauma. Walkins will receive x-rays if needed. °NOTE: Dental cleaning is not an emergency. °Payment Options: °PAYMENT IS DUE AT THE TIME OF SERVICE. °Minimum co-pay is $40.00 for uninsured patients. °Minimum co-pay is $3.00 for Medicaid with dental coverage. °Dental Insurance is accepted and must be presented at time of visit. °Medicare does not cover dental. °Forms of payment: Cash, credit card, checks. °Best way to get seen: °If not previously registered with the clinic, walk-in dental registration begins at 7:15 am and is on a first come/first serve basis. °If previously registered with the clinic, call to make an appointment. °  °  °[facility] °919-776-4359 °[place] RESIDENTS ONLY °  °Location: °[address], [place], [place] °Clinic Hours: °Mon-Thu 10a-2p °Services: Extractions only! °Payment Options: °FREE (donations accepted) - bring proof of income or support °Best way to get seen: °Call and schedule an appointment OR come at 8am on the 1st Monday of every month (except for holidays) when it is first come/first served. °  °  °[facility] °[phone] °  °Location: °[address], [place] °Clinic Hours: °Friday mornings °Services, Payment Options, Best way to get seen: °Call for info °

## 2017-07-10 NOTE — ED Triage Notes (Signed)
Pt reports sore throat for the past day, pt states she recently had strep throat and ear infection a few months ago.  Pt states she prescribed Cefdnir and took a dose over night.  Pt states she is having pressure in the left ear and starting in right ear.

## 2017-09-03 ENCOUNTER — Emergency Department
Admission: EM | Admit: 2017-09-03 | Discharge: 2017-09-03 | Disposition: A | Discharge status: Home / Self Care | Payer: Self-pay | Attending: Emergency Medicine | Admitting: Emergency Medicine

## 2017-09-03 ENCOUNTER — Encounter: Payer: Self-pay | Admitting: Emergency Medicine

## 2017-09-03 DIAGNOSIS — Z79899 Other long term (current) drug therapy: Secondary | ICD-10-CM | POA: Insufficient documentation

## 2017-09-03 DIAGNOSIS — R0981 Nasal congestion: Secondary | ICD-10-CM | POA: Insufficient documentation

## 2017-09-03 DIAGNOSIS — F17210 Nicotine dependence, cigarettes, uncomplicated: Secondary | ICD-10-CM | POA: Insufficient documentation

## 2017-09-03 DIAGNOSIS — J209 Acute bronchitis, unspecified: Secondary | ICD-10-CM | POA: Insufficient documentation

## 2017-09-03 DIAGNOSIS — J029 Acute pharyngitis, unspecified: Secondary | ICD-10-CM | POA: Insufficient documentation

## 2017-09-03 MED ORDER — AMOXICILLIN-POT CLAVULANATE 875-125 MG PO TABS
1.0000 | ORAL_TABLET | Freq: Once | ORAL | Status: AC
  Administered 2017-09-03: 1 via ORAL
  Filled 2017-09-03: qty 1

## 2017-09-03 MED ORDER — AMOXICILLIN-POT CLAVULANATE 875-125 MG PO TABS: 1.0000 | ORAL_TABLET | Freq: Two times a day (BID) | ORAL | 0 refills | Status: AC

## 2017-09-03 MED ORDER — FLUCONAZOLE 50 MG PO TABS
150.0000 mg | ORAL_TABLET | Freq: Once | ORAL | Status: AC
  Administered 2017-09-03: 150 mg via ORAL

## 2017-09-03 MED ORDER — FLUCONAZOLE 100 MG PO TABS
ORAL_TABLET | ORAL | Status: AC
  Filled 2017-09-03: qty 1

## 2017-09-03 MED ORDER — FLUCONAZOLE 50 MG PO TABS
ORAL_TABLET | ORAL | Status: AC
  Filled 2017-09-03: qty 1

## 2017-09-03 NOTE — ED Provider Notes (Signed)
[facility] Emergency Department Provider Note    First MD Initiated Contact with Patient 09/03/17 [id number]     (approximate)  I have reviewed the triage vital signs and the nursing notes.   HISTORY  Chief Complaint Nasal Congestion; Cough; and Ear Drainage   HPI Damian Leavellrudy Kallie EdwardGarcia Travers is a 31 y.o. female presents to the emergency department with nasal congestion left earache and nonproductive cough which began this morning.  Patient denies any fever afebrile on presentation to 98.2.  Of note patient states that her son had similar symptoms for which she was treated with amoxicillin.   Past Medical History:  Diagnosis Date  . Scoliosis    S curve    There are no active problems to display for this patient.   Past Surgical History:  Procedure Laterality Date  . CHOLECYSTECTOMY     2009    Prior to Admission medications   Medication Sig Start Date End Date Taking? Authorizing Provider  amoxicillin (AMOXIL) 875 MG tablet Take 1 tablet (875 mg total) by mouth 2 (two) times daily. 06/30/16   Faythe GheeFisher, Susan W, PA  amoxicillin-clavulanate (AUGMENTIN) 875-125 MG tablet Take 1 tablet by mouth 2 (two) times daily for 10 days. 09/03/17 09/13/17  Darci CurrentBrown, [place] N, MD  fluticasone (FLONASE) 50 MCG/ACT nasal spray Place 2 sprays into both nostrils daily. 07/10/17 07/10/18  Enid DerryWagner, Ashley, PA-C  levonorgestrel (MIRENA) 20 MCG/24HR IUD 1 each by Intrauterine route once.    [provider]  magic mouthwash w/lidocaine SOLN Take 5 mLs by mouth 4 (four) times daily. 07/10/17   Enid DerryWagner, Ashley, PA-C    Allergies Sulfa antibiotics  No family history on file.  Social History Social History   Tobacco Use  . Smoking status: Light Tobacco Smoker  . Smokeless tobacco: Never Used  Substance Use Topics  . Alcohol use: Yes    Comment: social   . Drug use: No    Review of Systems Constitutional: No fever/chills Eyes: No visual changes. ENT: No sore throat.   Positive for nasal congestion and sore throat Cardiovascular: Denies chest pain. Respiratory: Denies shortness of breath.  Positive for cough Gastrointestinal: No abdominal pain.  No nausea, no vomiting.  No diarrhea.  No constipation. Genitourinary: Negative for dysuria. Musculoskeletal: Negative for neck pain.  Negative for back pain. Integumentary: Negative for rash. Neurological: Negative for headaches, focal weakness or numbness.  ____________________________________________   PHYSICAL EXAM:  VITAL SIGNS: ED Triage Vitals  Enc Vitals Group     BP 09/03/17 0519 132/74     Pulse Rate 09/03/17 0519 88     Resp 09/03/17 0519 18     Temp 09/03/17 0519 98.2 F (36.8 C)     Temp Source 09/03/17 0519 Oral     SpO2 09/03/17 0519 97 %     Weight 09/03/17 0512 90.7 kg (200 lb)     Height 09/03/17 0512 1.6 m (5\' 3" )     Head Circumference --      Peak Flow --      Pain Score 09/03/17 0512 6     Pain Loc --      Pain Edu? --      Excl. in GC? --     Constitutional: Alert and oriented. Well appearing and in no acute distress. Eyes: Conjunctivae are normal.  Head: Atraumatic. Ears:  Healthy appearing ear canals and TMs bilaterally Nose: Positive congestion/rhinnorhea. Mouth/Throat: Mucous membranes are moist.  Oropharynx erythematous without exudate Neck: No stridor.  Positive for left submandibular lymphadenopathy Cardiovascular: Normal rate, regular rhythm. Good peripheral circulation. Grossly normal heart sounds. Respiratory: Normal respiratory effort.  No retractions. Lungs CTAB. Gastrointestinal: Soft and nontender. No distention.  Musculoskeletal: No lower extremity tenderness nor edema. No gross deformities of extremities. Neurologic:  Normal speech and language. No gross focal neurologic deficits are appreciated.  Skin:  Skin is warm, dry and intact. No rash noted. Psychiatric: Mood and affect are normal. Speech and behavior are  normal.    Procedures   ____________________________________________   INITIAL IMPRESSION / ASSESSMENT AND PLAN / ED COURSE  As part of my medical decision making, I reviewed the following data within the electronic MEDICAL RECORD [id number] year old female presented with history and physical exam consistent with pharyngitis and bronchitis.  I strongly doubt possibility of pneumonia given no adventitious sounds noted on auscultation as her chest x-ray not performed. ____________________________________________  FINAL CLINICAL IMPRESSION(S) / ED DIAGNOSES  Final diagnoses:  Acute bronchitis, unspecified organism  Pharyngitis, unspecified etiology     MEDICATIONS GIVEN DURING THIS VISIT:  Medications  fluconazole (DIFLUCAN) tablet 150 mg (not administered)  fluconazole (DIFLUCAN) 50 MG tablet (not administered)  fluconazole (DIFLUCAN) 100 MG tablet (not administered)  amoxicillin-clavulanate (AUGMENTIN) 875-125 MG per tablet 1 tablet (1 tablet Oral Given 09/03/17 0602)     ED Discharge Orders        Ordered    amoxicillin-clavulanate (AUGMENTIN) 875-125 MG tablet  2 times daily     09/03/17 0616       Note:  This document was prepared using Dragon voice recognition software and may include unintentional dictation errors.    Darci CurrentBrown, [place] N, MD 09/03/17 [phone]

## 2017-09-03 NOTE — ED Triage Notes (Signed)
Patient ambulatory to triage with steady gait, without difficulty or distress noted; pt reports awoke with congestion, left earache, cough

## 2017-09-04 ENCOUNTER — Emergency Department
Admission: EM | Admit: 2017-09-04 | Discharge: 2017-09-05 | Disposition: A | Discharge status: Home / Self Care | Payer: Self-pay | Attending: Emergency Medicine | Admitting: Emergency Medicine

## 2017-09-04 DIAGNOSIS — F430 Acute stress reaction: Secondary | ICD-10-CM | POA: Insufficient documentation

## 2017-09-04 DIAGNOSIS — F4320 Adjustment disorder, unspecified: Secondary | ICD-10-CM | POA: Insufficient documentation

## 2017-09-04 DIAGNOSIS — F321 Major depressive disorder, single episode, moderate: Secondary | ICD-10-CM | POA: Insufficient documentation

## 2017-09-04 DIAGNOSIS — F17200 Nicotine dependence, unspecified, uncomplicated: Secondary | ICD-10-CM | POA: Insufficient documentation

## 2017-09-04 DIAGNOSIS — Z79899 Other long term (current) drug therapy: Secondary | ICD-10-CM | POA: Insufficient documentation

## 2017-09-04 LAB — POCT PREGNANCY, URINE: PREG TEST UR: NEGATIVE

## 2017-09-04 LAB — URINE DRUG SCREEN, QUALITATIVE (ARMC ONLY)
AMPHETAMINES, UR SCREEN: NOT DETECTED
BARBITURATES, UR SCREEN: NOT DETECTED
BENZODIAZEPINE, UR SCRN: NOT DETECTED
Cannabinoid 50 Ng, Ur ~~LOC~~: POSITIVE — AB
Cocaine Metabolite,Ur ~~LOC~~: NOT DETECTED
MDMA (Ecstasy)Ur Screen: NOT DETECTED
METHADONE SCREEN, URINE: NOT DETECTED
Opiate, Ur Screen: NOT DETECTED
PHENCYCLIDINE (PCP) UR S: NOT DETECTED
Tricyclic, Ur Screen: NOT DETECTED

## 2017-09-04 LAB — COMPREHENSIVE METABOLIC PANEL
ALT: 14 U/L (ref 14–54)
ANION GAP: 11 (ref 5–15)
AST: 20 U/L (ref 15–41)
Albumin: 4.2 g/dL (ref 3.5–5.0)
Alkaline Phosphatase: 67 U/L (ref 38–126)
BILIRUBIN TOTAL: 0.1 mg/dL — AB (ref 0.3–1.2)
BUN: 8 mg/dL (ref 6–20)
CO2: 24 mmol/L (ref 22–32)
Calcium: 9.3 mg/dL (ref 8.9–10.3)
Chloride: 102 mmol/L (ref 101–111)
Creatinine, Ser: 0.71 mg/dL (ref 0.44–1.00)
GFR calc Af Amer: >60 mL/min (ref >60)
Glucose, Bld: 116 mg/dL — ABNORMAL HIGH (ref 65–99)
POTASSIUM: 3.8 mmol/L (ref 3.5–5.1)
Sodium: 137 mmol/L (ref 135–145)
TOTAL PROTEIN: 8 g/dL (ref 6.5–8.1)

## 2017-09-04 LAB — CBC
HCT: 42.9 % (ref 35.0–47.0)
Hemoglobin: 14.5 g/dL (ref 12.0–16.0)
MCH: 30.2 pg (ref 26.0–34.0)
MCHC: 33.7 g/dL (ref 32.0–36.0)
MCV: 89.6 fL (ref 80.0–100.0)
PLATELETS: 344 10*3/uL (ref 150–440)
RBC: 4.79 MIL/uL (ref 3.80–5.20)
RDW: 13.1 % (ref 11.5–14.5)
WBC: 13.6 10*3/uL — ABNORMAL HIGH (ref 3.6–11.0)

## 2017-09-04 LAB — ACETAMINOPHEN LEVEL

## 2017-09-04 LAB — ETHANOL

## 2017-09-04 LAB — SALICYLATE LEVEL: Salicylate Lvl: <7 mg/dL (ref 2.8–30.0)

## 2017-09-04 MED ORDER — AMOXICILLIN-POT CLAVULANATE 875-125 MG PO TABS
1.0000 | ORAL_TABLET | Freq: Two times a day (BID) | ORAL | Status: DC
  Administered 2017-09-05 (×2): 1 via ORAL
  Filled 2017-09-04 (×3): qty 1

## 2017-09-04 MED ORDER — IBUPROFEN 600 MG PO TABS
600.0000 mg | ORAL_TABLET | Freq: Once | ORAL | Status: AC
  Administered 2017-09-04: 600 mg via ORAL

## 2017-09-04 MED ORDER — IBUPROFEN 600 MG PO TABS
ORAL_TABLET | ORAL | Status: AC
  Administered 2017-09-04: 600 mg via ORAL
  Filled 2017-09-04: qty 1

## 2017-09-04 NOTE — ED Notes (Signed)
Pt. To BHU from ED ambulatory without difficulty, to room  BHU1. Report from Matt RN. Pt. Is alert and oriented, warm and dry in no distress. Pt. Denies SI, HI, and AVH. Pt. Calm and cooperative. Pt. Made aware of security cameras and Q15 minute rounds. Pt. Encouraged to let Nursing staff know of any concerns or needs.   

## 2017-09-04 NOTE — ED Provider Notes (Signed)
[facility] Emergency Department Provider Note  ____________________________________________   I have reviewed the triage vital signs and the nursing notes.   HISTORY  Chief Complaint Psychiatric Evaluation   History limited by: Not Limited   HPI Katie Vazquez is a 31 y.o. female who presents to the emergency department today under IVC because of concern for suicidal ideation.  DURATION:today CONTEXT: Patient states that she has had a lot of stressors in her life lately. Primarily around her work and financial situation. Today she states that she felt it was almost too much for her to handle. She does state that she made a comment to her son about people who kill themselves going to hell. Additionally she visited her father. Per IVC paperwork there was concern she was saying goodbye. Patient denies diagnosis of depression in the past.  MODIFYING FACTORS: none ASSOCIATED SYMPTOMS: patient has had some nausea and upset stomach today. Also had headache.  Per medical record review patient without psychiatric history.  Past Medical History:  Diagnosis Date  . Scoliosis    S curve    There are no active problems to display for this patient.   Past Surgical History:  Procedure Laterality Date  . CHOLECYSTECTOMY     2009    Prior to Admission medications   Medication Sig Start Date End Date Taking? Authorizing Provider  amoxicillin (AMOXIL) 875 MG tablet Take 1 tablet (875 mg total) by mouth 2 (two) times daily. 06/30/16   Faythe GheeFisher, Susan W, PA  amoxicillin-clavulanate (AUGMENTIN) 875-125 MG tablet Take 1 tablet by mouth 2 (two) times daily for 10 days. 09/03/17 09/13/17  Darci CurrentBrown,  N, MD  fluticasone (FLONASE) 50 MCG/ACT nasal spray Place 2 sprays into both nostrils daily. 07/10/17 07/10/18  Enid DerryWagner, Ashley, PA-C  levonorgestrel (MIRENA) 20 MCG/24HR IUD 1 each by Intrauterine route once.    [provider]  magic mouthwash w/lidocaine  SOLN Take 5 mLs by mouth 4 (four) times daily. 07/10/17   Enid DerryWagner, Ashley, PA-C    Allergies Sulfa antibiotics  No family history on file.  Social History Social History   Tobacco Use  . Smoking status: Light Tobacco Smoker  . Smokeless tobacco: Never Used  Substance Use Topics  . Alcohol use: Yes    Comment: social   . Drug use: No    Review of Systems Constitutional: No fever/chills Eyes: No visual changes. ENT: No sore throat. Cardiovascular: Denies chest pain. Respiratory: Denies shortness of breath. Gastrointestinal: Positive for upset stomach. Genitourinary: Negative for dysuria. Musculoskeletal: Negative for back pain. Skin: Negative for rash. Neurological: Positive for headache.  ____________________________________________   PHYSICAL EXAM:  VITAL SIGNS: ED Triage Vitals  Enc Vitals Group     BP 09/04/17 2035 (!) 149/84     Pulse Rate 09/04/17 2035 72     Resp 09/04/17 2035 16     Temp 09/04/17 2035 99.1 F (37.3 C)     Temp Source 09/04/17 2035 Oral     SpO2 09/04/17 2035 100 %     Weight 09/04/17 2034 207 lb (93.9 kg)     Height 09/04/17 2034 5\' 3"  (1.6 m)     Head Circumference --      Peak Flow --      Pain Score 09/04/17 2035 6   Constitutional: Alert and oriented. Eyes: Conjunctivae are normal.  ENT   Head: Normocephalic and atraumatic.   Nose: No congestion/rhinnorhea.   Mouth/Throat: Mucous membranes are moist.   Neck: No stridor. Hematological/Lymphatic/Immunilogical:  No cervical lymphadenopathy. Cardiovascular: Normal rate, regular rhythm.  No murmurs, rubs, or gallops.  Respiratory: Normal respiratory effort without tachypnea nor retractions. Breath sounds are clear and equal bilaterally. No wheezes/rales/rhonchi. Genitourinary: Deferred Musculoskeletal: Normal range of motion in all extremities. No lower extremity edema. Neurologic:  Normal speech and language. No gross focal neurologic deficits are appreciated.  Skin:   Skin is warm, dry and intact. No rash noted. Psychiatric: Denies any SI.  ____________________________________________    LABS (pertinent positives/negatives)  UDS cannabinoid positive Upreg negative Tylenol, salicylate and ethanol negative CBC wbc 13.6 otherwise wnl CMP glu 116 otherwise wnl ____________________________________________   EKG  None  ____________________________________________    RADIOLOGY  None  ____________________________________________   PROCEDURES  Procedures  ____________________________________________   INITIAL IMPRESSION / ASSESSMENT AND PLAN / ED COURSE  Pertinent labs & imaging results that were available during my care of the patient were reviewed by me and considered in my medical decision making (see chart for details).  Patient presents to the emergency department today because of concern for SI and under IVC. Patient does readily admit to having many life stressors at the moment. Denies any SI at this time. States it was just an episode. Will have SOC evaluate patient.    ____________________________________________   FINAL CLINICAL IMPRESSION(S) / ED DIAGNOSES  Stress  Note: This dictation was prepared with Dragon dictation. Any transcriptional errors that result from this process are unintentional     Phineas SemenGoodman, Yeudiel Mateo, MD 09/05/17 0005

## 2017-09-04 NOTE — ED Triage Notes (Signed)
Pt here with ivc papers with bpd. Pt states she is not suicidal. Pt states she had a meltdown today and "said something that was taken out of context". Pt appears angry, but is cooperative.

## 2017-09-04 NOTE — ED Notes (Signed)
ED Provider at bedside. 

## 2017-09-04 NOTE — ED Notes (Signed)

## 2017-09-05 MED ORDER — CITALOPRAM HYDROBROMIDE 10 MG PO TABS: 10.0000 mg | ORAL_TABLET | Freq: Every day | ORAL | 0 refills | Status: AC

## 2017-09-05 NOTE — ED Provider Notes (Signed)
-----------------------------------------   4:15 AM on 09/05/2017 -----------------------------------------   Blood pressure (!) 149/84, pulse 72, temperature 99.1 F (37.3 C), temperature source Oral, resp. rate 16, height _0  (1.6 m), weight 93.9 kg (207 lb), last menstrual period 08/18/2017, SpO2 100 %.  The patient had no acute events since last update.  Calm and cooperative at this time.  The patient was seen by tele-psychiatry and they did not feel the patient met inpatient criteria.  They did recommend starting the patient on Celexa 78m daily and feel that the patient has some major depression disorder.  The tele-psychiatrist did rescind the patient's IVC.  She will be discharged home to follow-up.     WLoney Hering MD 09/05/17 [phone]

## 2017-09-05 NOTE — ED Notes (Signed)
Woke up patient to advise paperwork was complete for discharge. Patient requested to stay until later in morning.

## 2017-09-05 NOTE — BH Assessment (Signed)
Assessment Note  Katie Vazquez is an [age] y.o. female who came into the ER due to SI. Pt was brought in by [employer] after [facility] reports that pt was drinking alcohol this morning before taking her son to school and upon arriving to the school she informed her son that he would not see her anymore because she would be in [place]. Te report goes on to say that her son, upset by what he was just told, then told his teaChers what his mother said. THe school contacted CPS. The pt the drove to her father's house to also tell him goodbye, stating that this would be the last time she ever saw him.  Pt states that she has been under a lot of pressure to provide a good Christmas and birthday (son's birthday is also on the 25th) and does not have the financial means to cover both events. Pt states having feelings of guild because she wasn't able to give her son a good Christmas last year due to unemployment and son has been asking how she plans to make up for last year. Though stressed pt denies having current feelings of SI or HI and says that her son took her statement "out of context". Pt denies have any A/V hallucinations.  When asked about drug use client states that she does smoke marijuana occasionally, as little as twice a week and drinks socially .    Diagnosis: Major Depressive Disorder Moderate   Past Medical History:  Past Medical History:  Diagnosis Date  . Scoliosis    S curve    Past Surgical History:  Procedure Laterality Date  . CHOLECYSTECTOMY     2009    Family History: No family history on file.  Social History:  reports that she has been smoking.  she has never used smokeless tobacco. She reports that she drinks alcohol. She reports that she does not use drugs.  Additional Social History:  Alcohol / Drug Use Pain Medications: See MAR Prescriptions: See MAR Over the Counter: See MAR History of alcohol / drug use?: Yes(hx of marijuana use) Longest  period of sobriety (when/how long): Unknown  CIWA: CIWA-Ar BP: (!) 149/84 Pulse Rate: 72 COWS:    Allergies:  Allergies  Allergen Reactions  . Sulfa Antibiotics Rash    Home Medications:  (Not in a hospital admission)  OB/GYN Status:  Patient's last menstrual period was 08/18/2017.  General Assessment Data Location of Assessment: [facility] ED TTS Assessment: In system Is this a Tele or Face-to-Face Assessment?: Face-to-Face Is this an Initial Assessment or a Re-assessment for this encounter?: Initial Assessment Marital status: Single Is patient pregnant?: No Pregnancy Status: No Living Arrangements: Children Can pt return to current living arrangement?: Yes Admission Status: Involuntary Is patient capable of signing voluntary admission?: No Referral Source: Self/Family/Friend Insurance type: n/a  Medical Screening Exam [facility] Walk-in ONLY) Medical Exam completed: Yes  Crisis Care Plan Living Arrangements: Children Legal Guardian: Other:(self) Name of Psychiatrist: none Name of Therapist: none  Education Status Is patient currently in school?: No Current Grade: n/a Highest grade of school patient has completed: some college Name of school: n/a Contact person: n/a  Risk to self with the past 6 months Suicidal Ideation: No Has patient been a risk to self within the past 6 months prior to admission? : Yes Suicidal Intent: No Has patient had any suicidal intent within the past 6 months prior to admission? : Yes Is patient at  risk for suicide?: No Suicidal Plan?: No Has patient had any suicidal plan within the past 6 months prior to admission? : Yes Access to Means: Yes Specify Access to Suicidal Means: (Pt has access to something she could hang herself with) What has been your use of drugs/alcohol within the last 12 months?: Uses marijuana twice a week Previous Attempts/Gestures: No How many times?: 0 Other Self Harm Risks: none  Triggers for Past Attempts: None  known Intentional Self Injurious Behavior: None Family Suicide History: No Recent stressful life event(s): Financial Problems, Job Loss Persecutory voices/beliefs?: No Depression: Yes Depression Symptoms: Feeling worthless/self pity, Insomnia, Guilt Substance abuse history and/or treatment for substance abuse?: Yes Suicide prevention information given to non-admitted patients: Not applicable  Risk to Others within the past 6 months Homicidal Ideation: No Does patient have any lifetime risk of violence toward others beyond the six months prior to admission? : No Thoughts of Harm to Others: No Current Homicidal Intent: No Current Homicidal Plan: No Access to Homicidal Means: No Identified Victim: n/a History of harm to others?: No Assessment of Violence: None Noted Violent Behavior Description: none Does patient have access to weapons?: No Criminal Charges Pending?: No Does patient have a court date: No Is patient on probation?: No  Psychosis Hallucinations: None noted Delusions: None noted  Mental Status Report Appearance/Hygiene: In scrubs Eye Contact: Good Motor Activity: Freedom of movement, Unremarkable Speech: Logical/coherent Level of Consciousness: Alert Mood: Depressed Affect: Appropriate to circumstance, Depressed Anxiety Level: Moderate Thought Processes: Coherent, Relevant Judgement: Unimpaired Orientation: Appropriate for developmental age, Person, Place, Time, Situation Obsessive Compulsive Thoughts/Behaviors: Minimal  Cognitive Functioning Concentration: Decreased Memory: Recent Intact, Remote Intact IQ: Average Insight: Good Impulse Control: Fair Appetite: Fair Weight Loss: 0 Weight Gain: 0 Sleep: Decreased Total Hours of Sleep: 6(6) Vegetative Symptoms: None  ADLScreening [facility] Assessment Services) Patient's cognitive ability adequate to safely complete daily activities?: Yes Patient able to express need for assistance with ADLs?:  Yes Independently performs ADLs?: Yes (appropriate for developmental age)  Prior Inpatient Therapy Prior Inpatient Therapy: No Prior Therapy Dates: n/a Prior Therapy Facilty/Provider(s): n/a Reason for Treatment: n/a  Prior Outpatient Therapy Prior Outpatient Therapy: No Prior Therapy Dates: n/a Prior Therapy Facilty/Provider(s): n/a Reason for Treatment: n/a Does patient have an ACCT team?: No Does patient have Intensive In-House Services?  : No Does patient have [facility] services? : No Does patient have P4CC services?: No  ADL Screening (condition at time of admission) Patient's cognitive ability adequate to safely complete daily activities?: Yes Is the patient deaf or have difficulty hearing?: No Does the patient have difficulty seeing, even when wearing glasses/contacts?: No Does the patient have difficulty concentrating, remembering, or making decisions?: No Patient able to express need for assistance with ADLs?: Yes Does the patient have difficulty dressing or bathing?: No Independently performs ADLs?: Yes (appropriate for developmental age) Does the patient have difficulty walking or climbing stairs?: No Weakness of Legs: None Weakness of Arms/Hands: None  Home Assistive Devices/Equipment Home Assistive Devices/Equipment: None  Therapy Consults (therapy consults require a physician order) PT Evaluation Needed: No OT Evalulation Needed: No SLP Evaluation Needed: No       Advance Directives (For Healthcare) Does Patient Have a Medical Advance Directive?: No    Additional Information 1:1 In Past 12 Months?: No CIRT Risk: No Elopement Risk: No Does patient have medical clearance?: Yes  Child/Adolescent Assessment Running Away Risk: Denies Bed-Wetting: Denies Destruction of Property: Denies Cruelty to Animals: Denies Stealing: Denies Rebellious/Defies Authority: Denies  Satanic Involvement: Denies Fire Setting: Denies Problems at School: Denies Gang  Involvement: Denies  Disposition:  Disposition Initial Assessment Completed for this Encounter: Yes Disposition of Patient: Pending Review with psychiatrist, Other dispositions Other disposition(s): Other (Comment)([facility]  On Site Evaluation by:   Reviewed with Physician:    Selso Mannor D Sierra Spargo 09/05/2017 2:21 AM

## 2017-09-05 NOTE — ED Notes (Addendum)
Pt to room att, this RN to room to introduce self and POC

## 2017-09-05 NOTE — ED Notes (Signed)
TTS in with patient.  

## 2017-09-05 NOTE — ED Notes (Signed)
Patient talking to mom on phone, she states that her mom will come and transport her home, Patient talks rapidly, she is even it admits that she is high strung, she does deny Si/HI or avh, Patient with q 15 minute checks and camera surveillance in progress for safety.

## 2017-09-05 NOTE — ED Notes (Signed)
SOC machine battery went dead during assessment after [facility] for [facility].

## 2017-09-05 NOTE — ED Notes (Signed)
Patient with discharge instructions, voiced understanding of discharge instructions, patient's prescriptions given to her, all belongings given to Patient. Patient's mom to transport home.

## 2017-09-05 NOTE — ED Notes (Signed)
Report given to SOC. SOC in progress.  

## 2017-09-05 NOTE — Discharge Instructions (Signed)
Please follow-up with your primary care physician or with the acute care clinic.  Please take the medications as prescribed.  Please return with any other conditions or concerns.

## 2017-09-05 NOTE — ED Notes (Signed)
Patient ambulated to lobby with nurse and nurse met her mom and son, Patient with appropriate interaction with family and Patient left with mom, no signs of distress.
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
## 2018-02-24 ENCOUNTER — Other Ambulatory Visit: Payer: Self-pay

## 2018-02-24 ENCOUNTER — Encounter: Payer: Self-pay | Admitting: Emergency Medicine

## 2018-02-24 ENCOUNTER — Emergency Department
Admission: EM | Admit: 2018-02-24 | Discharge: 2018-02-24 | Disposition: A | Discharge status: Home / Self Care | Payer: Self-pay | Attending: Emergency Medicine | Admitting: Emergency Medicine

## 2018-02-24 DIAGNOSIS — F17200 Nicotine dependence, unspecified, uncomplicated: Secondary | ICD-10-CM | POA: Insufficient documentation

## 2018-02-24 DIAGNOSIS — Z79899 Other long term (current) drug therapy: Secondary | ICD-10-CM | POA: Insufficient documentation

## 2018-02-24 DIAGNOSIS — L239 Allergic contact dermatitis, unspecified cause: Secondary | ICD-10-CM | POA: Insufficient documentation

## 2018-02-24 MED ORDER — METHYLPREDNISOLONE 4 MG PO TBPK: ORAL_TABLET | ORAL | 0 refills | Status: AC

## 2018-02-24 MED ORDER — DEXAMETHASONE SODIUM PHOSPHATE 10 MG/ML IJ SOLN
10.0000 mg | Freq: Once | INTRAMUSCULAR | Status: AC
  Administered 2018-02-24: 10 mg via INTRAMUSCULAR
  Filled 2018-02-24: qty 1

## 2018-02-24 MED ORDER — HYDROXYZINE HCL 50 MG PO TABS
50.0000 mg | ORAL_TABLET | Freq: Once | ORAL | Status: AC
  Administered 2018-02-24: 50 mg via ORAL
  Filled 2018-02-24: qty 1

## 2018-02-24 MED ORDER — HYDROCORTISONE 1 % EX OINT: 1.0000 "application " | TOPICAL_OINTMENT | Freq: Two times a day (BID) | CUTANEOUS | 0 refills | Status: AC

## 2018-02-24 MED ORDER — HYDROXYZINE HCL 50 MG PO TABS: 50.0000 mg | ORAL_TABLET | Freq: Three times a day (TID) | ORAL | 0 refills | Status: AC | PRN

## 2018-02-24 NOTE — ED Provider Notes (Signed)
[facility] Emergency Department Provider Note   ____________________________________________   First MD Initiated Contact with Patient 02/24/18 [phone]     (approximate)  I have reviewed the triage vital signs and the nursing notes.   HISTORY  Chief Complaint Rash    HPI Katie Vazquez is a 32 y.o. female patient complain a rash to the upper chest for couple days.  Patient stated rash is "itchy".  Patient is concerned because she has been exposed to scabies.  Patient denies increased nocturnal itching.  Patient denies lesions on any other part of her body.  No palates this measures for complaint.  Past Medical History:  Diagnosis Date  . Scoliosis    S curve    There are no active problems to display for this patient.   Past Surgical History:  Procedure Laterality Date  . CHOLECYSTECTOMY     2009    Prior to Admission medications   Medication Sig Start Date End Date Taking? Authorizing Provider  citalopram (CELEXA) 10 MG tablet Take 1 tablet (10 mg total) by mouth daily. 09/05/17 09/05/18  Rebecka Apley, MD  fluticasone (FLONASE) 50 MCG/ACT nasal spray Place 2 sprays into both nostrils daily. 07/10/17 07/10/18  Enid Derry, PA-C  hydrocortisone 1 % ointment Apply 1 application topically 2 (two) times daily. 02/24/18   Joni Reining, PA-C  hydrOXYzine (ATARAX/VISTARIL) 50 MG tablet Take 1 tablet (50 mg total) by mouth 3 (three) times daily as needed. 02/24/18   Joni Reining, PA-C  levonorgestrel (MIRENA) 20 MCG/24HR IUD 1 each by Intrauterine route once.    [provider]  methylPREDNISolone (MEDROL DOSEPAK) 4 MG TBPK tablet Take Tapered dose as directed 02/24/18   Joni Reining, PA-C    Allergies Sulfa antibiotics  No family history on file.  Social History Social History   Tobacco Use  . Smoking status: Light Tobacco Smoker  . Smokeless tobacco: Never Used  Substance Use Topics  . Alcohol use: Yes   Comment: social   . Drug use: No    Review of Systems Constitutional: No fever/chills Eyes: No visual changes. ENT: No sore throat. Cardiovascular: Denies chest pain. Respiratory: Denies shortness of breath. Gastrointestinal: No abdominal pain.  No nausea, no vomiting.  No diarrhea.  No constipation. Genitourinary: Negative for dysuria. Musculoskeletal: Negative for back pain. Skin: Positive for rash. Neurological: Negative for headaches, focal weakness or numbness. Allergic/Immunilogical: Sulfa antibiotics ____________________________________________   PHYSICAL EXAM:  VITAL SIGNS: ED Triage Vitals [02/24/18 0919]  Enc Vitals Group     BP 128/84     Pulse Rate 68     Resp 16     Temp 98.6 F (37 C)     Temp Source Oral     SpO2 98 %     Weight 215 lb (97.5 kg)     Height  (1.6 m)     Head Circumference      Peak Flow      Pain Score 0     Pain Loc      Pain Edu?      Excl. in GC?    Constitutional: Alert and oriented. Well appearing and in no acute distress. Neck: No stridor. Hematological/Lymphatic/Immunilogical: No cervical lymphadenopathy. Cardiovascular: Normal rate, regular rhythm. Grossly normal heart sounds.  Good peripheral circulation. Respiratory: Normal respiratory effort.  No retractions. Lungs CTAB. Neurologic:  Normal speech and language. No gross focal neurologic deficits are appreciated. No gait instability. Skin:  Skin is  warm, dry and intact.  Papular vesicular lesions anterior upper chest. Psychiatric: Mood and affect are normal. Speech and behavior are normal.  ____________________________________________   LABS (all labs ordered are listed, but only abnormal results are displayed)  Labs Reviewed - No data to display ____________________________________________  EKG   ____________________________________________  RADIOLOGY  ED MD interpretation:    Official radiology report(s): No results  found.  ____________________________________________   PROCEDURES  Procedure(s) performed: None  Procedures  Critical Care performed: No  ____________________________________________   INITIAL IMPRESSION / ASSESSMENT AND PLAN / ED COURSE  As part of my medical decision making, I reviewed the following data within the electronic MEDICAL RECORD NUMBER    Contact dermatology etiology unknown.  Patient given discharge care instruction.  Patient given prescription for Medrol Dosepak, Atarax, and topical hydrocortisone.  [facility] no improvement in 3 to 5 days.      ____________________________________________   FINAL CLINICAL IMPRESSION(S) / ED DIAGNOSES  Final diagnoses:  Allergic contact dermatitis, unspecified trigger     ED Discharge Orders        Ordered    methylPREDNISolone (MEDROL DOSEPAK) 4 MG TBPK tablet     02/24/18 0927    hydrOXYzine (ATARAX/VISTARIL) 50 MG tablet  3 times daily PRN     02/24/18 0927    hydrocortisone 1 % ointment  2 times daily     02/24/18 0865       Note:  This document was prepared using Dragon voice recognition software and may include unintentional dictation errors.    Joni Reining, PA-C 02/24/18 0932    Sharman Cheek, MD 02/24/18 [phone]

## 2018-02-24 NOTE — ED Triage Notes (Signed)
Presents with rash to upper chest for couple of days ..states rash is "itchy" no rash noted on hands or legs  resp even and non labored States she was around someone with scabies

## 2018-10-06 ENCOUNTER — Other Ambulatory Visit: Payer: Self-pay

## 2018-10-06 ENCOUNTER — Encounter: Payer: Self-pay | Admitting: Emergency Medicine

## 2018-10-06 ENCOUNTER — Emergency Department
Admission: EM | Admit: 2018-10-06 | Discharge: 2018-10-06 | Disposition: A | Discharge status: Home / Self Care | Payer: Self-pay | Attending: Emergency Medicine | Admitting: Emergency Medicine

## 2018-10-06 DIAGNOSIS — J019 Acute sinusitis, unspecified: Secondary | ICD-10-CM | POA: Insufficient documentation

## 2018-10-06 DIAGNOSIS — H9202 Otalgia, left ear: Secondary | ICD-10-CM | POA: Insufficient documentation

## 2018-10-06 DIAGNOSIS — B9789 Other viral agents as the cause of diseases classified elsewhere: Secondary | ICD-10-CM | POA: Insufficient documentation

## 2018-10-06 DIAGNOSIS — F17200 Nicotine dependence, unspecified, uncomplicated: Secondary | ICD-10-CM | POA: Insufficient documentation

## 2018-10-06 MED ORDER — DEXAMETHASONE SODIUM PHOSPHATE 10 MG/ML IJ SOLN
INTRAMUSCULAR | Status: AC
  Filled 2018-10-06: qty 1

## 2018-10-06 MED ORDER — DEXAMETHASONE 10 MG/ML FOR PEDIATRIC ORAL USE
10.0000 mg | Freq: Once | INTRAMUSCULAR | Status: AC
  Administered 2018-10-06: 10 mg via ORAL

## 2018-10-06 NOTE — Discharge Instructions (Signed)
As we discussed, at this time I believe that your symptoms are the result of a viral infection.  Please read through the included information about management recommendations such as using a humidifier, performing sinus rinses, and using over-the-counter medication/decongestants.  You may use decongestant nasal spray if absolutely necessary but only use it with caution because it can become habit forming.  Please follow-up as an outpatient as needed and return to the emergency department with new or worsening symptoms that concern you.

## 2018-10-06 NOTE — ED Provider Notes (Signed)
[facility] Emergency Department Provider Note  ____________________________________________   First MD Initiated Contact with Patient 10/06/18 [phone]     (approximate)  I have reviewed the triage vital signs and the nursing notes.   HISTORY  Chief Complaint Nasal Congestion    HPI Katie Vazquez is a 33 y.o. female who denies any chronic medical issues and presents for evaluation of 4 days of severe nasal congestion and drainage down the back of her throat.  She states that she thought it might just be viral but because it is been going on for 4 days she thought she should get checked out to find out if she needs antibiotics.  She denies any other significant symptoms including no shortness of breath, cough, nausea, vomiting, abdominal pain.  She says that the symptoms are worse when she first wakes up and she feels like she has a lot of dried sputum in the back of her throat.  She denies fever/chills and chest pain as well.  She has been using over-the-counter decongestant and cough/cold medicine and it does seem to help but the symptoms have not resolved.  She describes them as severe.  She is having some pressure behind her left ear but it has gotten better over the last couple of days.  Past Medical History:  Diagnosis Date  . Scoliosis    S curve    There are no active problems to display for this patient.   Past Surgical History:  Procedure Laterality Date  . CHOLECYSTECTOMY     2009    Prior to Admission medications   Medication Sig Start Date End Date Taking? Authorizing Provider  citalopram (CELEXA) 10 MG tablet Take 1 tablet (10 mg total) by mouth daily. 09/05/17 09/05/18  Rebecka Apley, MD  fluticasone (FLONASE) 50 MCG/ACT nasal spray Place 2 sprays into both nostrils daily. 07/10/17 07/10/18  Enid Derry, PA-C  hydrocortisone 1 % ointment Apply 1 application topically 2 (two) times daily. 02/24/18   Joni Reining, PA-C    hydrOXYzine (ATARAX/VISTARIL) 50 MG tablet Take 1 tablet (50 mg total) by mouth 3 (three) times daily as needed. 02/24/18   Joni Reining, PA-C  levonorgestrel (MIRENA) 20 MCG/24HR IUD 1 each by Intrauterine route once.    [provider]  methylPREDNISolone (MEDROL DOSEPAK) 4 MG TBPK tablet Take Tapered dose as directed 02/24/18   Joni Reining, PA-C    Allergies Sulfa antibiotics  No family history on file.  Social History Social History   Tobacco Use  . Smoking status: Light Tobacco Smoker  . Smokeless tobacco: Never Used  Substance Use Topics  . Alcohol use: Yes    Comment: social   . Drug use: No    Review of Systems Constitutional: No fever/chills Eyes: No visual changes. ENT: Severe nasal congestion with postnasal drip.  Initially mild sore throat which has resolved.  Pressure behind left ear. Cardiovascular: Denies chest pain. Respiratory: Denies shortness of breath. Gastrointestinal: No abdominal pain.  No nausea, no vomiting.  No diarrhea.  No constipation. Genitourinary: Negative for dysuria. Integumentary: Negative for rash. Neurological: Negative for headaches, focal weakness or numbness.   ____________________________________________   PHYSICAL EXAM:  VITAL SIGNS: ED Triage Vitals  Enc Vitals Group     BP 10/06/18 0521 132/77     Pulse Rate 10/06/18 0521 62     Resp 10/06/18 0521 20     Temp 10/06/18 0521 98.2 F (36.8 C)     Temp  Source 10/06/18 0521 Oral     SpO2 10/06/18 0521 96 %     Weight 10/06/18 0520 94.3 kg (208 lb)     Height 10/06/18 0520 1.626 m (5\' 4" )     Head Circumference --      Peak Flow --      Pain Score 10/06/18 0520 0     Pain Loc --      Pain Edu? --      Excl. in GC? --     Constitutional: Alert and oriented. Well appearing and in no acute distress. Eyes: Conjunctivae are normal.  Head: Atraumatic. Ears:  Healthy appearing ear canals and TMs bilaterally Nose: Significant nasal congestion.  No tenderness  palpation of the maxillary sinuses. Mouth/Throat: Mucous membranes are moist.  Oropharynx non-erythematous.  No exudate. Neck: No stridor.  No meningeal signs.   Cardiovascular: Normal rate, regular rhythm. Good peripheral circulation. Grossly normal heart sounds. Respiratory: Normal respiratory effort.  No retractions. Lungs CTAB. Neurologic:  Normal speech and language. No gross focal neurologic deficits are appreciated.  Skin:  Skin is warm, dry and intact. No rash noted. Psychiatric: Mood and affect are normal. Speech and behavior are normal.   ____________________________________________   INITIAL IMPRESSION / ASSESSMENT AND PLAN / ED COURSE  As part of my medical decision making, I reviewed the following data within the electronic MEDICAL RECORD NUMBER Nursing notes reviewed and incorporated, Old chart reviewed and Notes from prior ED visits    Patient's signs and symptoms are consistent with a viral sinusitis.  She has no symptoms of systemic illness or bacterial infection.  I explained that 4 days is still a short course for her symptoms and that I do not feel antibiotics are indicated at this time.  I encouraged the use of a humidifier and over-the-counter medication according to label instructions.  I cautioned against the use of regular use of Afrin nasal spray but advised that it could be used sparingly if she is struggling to breathe while at work or in another situation where it is necessary.  She states she understands and agrees with the plan.  I gave my usual and customary return precautions.     ____________________________________________  FINAL CLINICAL IMPRESSION(S) / ED DIAGNOSES  Final diagnoses:  Acute viral sinusitis     MEDICATIONS GIVEN DURING THIS VISIT:  Medications  dexamethasone (DECADRON) 10 MG/ML injection for Pediatric ORAL use 10 mg (has no administration in time range)     ED Discharge Orders    None       Note:  This document was prepared  using Dragon voice recognition software and may include unintentional dictation errors.    Loleta Rose, MD 10/06/18 [phone]

## 2018-10-06 NOTE — ED Triage Notes (Signed)
Patient ambulatory to triage with steady gait, without difficulty or distress noted, mask in place; pt reports several days of sinus congestion

## 2019-05-04 ENCOUNTER — Ambulatory Visit: Payer: Medicaid Other

## 2019-05-04 ENCOUNTER — Other Ambulatory Visit: Payer: Self-pay

## 2019-05-04 DIAGNOSIS — N760 Acute vaginitis: Secondary | ICD-10-CM | POA: Diagnosis not present

## 2019-05-04 DIAGNOSIS — F419 Anxiety disorder, unspecified: Secondary | ICD-10-CM | POA: Diagnosis not present

## 2019-05-04 DIAGNOSIS — F329 Major depressive disorder, single episode, unspecified: Secondary | ICD-10-CM

## 2019-05-04 DIAGNOSIS — B9689 Other specified bacterial agents as the cause of diseases classified elsewhere: Secondary | ICD-10-CM

## 2019-05-04 DIAGNOSIS — Z113 Encounter for screening for infections with a predominantly sexual mode of transmission: Secondary | ICD-10-CM | POA: Diagnosis not present

## 2019-05-04 DIAGNOSIS — M419 Scoliosis, unspecified: Secondary | ICD-10-CM

## 2019-05-04 LAB — HM HIV SCREENING LAB: HM HIV Screening: NEGATIVE

## 2019-05-04 LAB — HM HEPATITIS C SCREENING LAB: HM Hepatitis Screen: NEGATIVE

## 2019-05-04 LAB — WET PREP FOR TRICH, YEAST, CLUE
Trichomonas Exam: NEGATIVE
Yeast Exam: NEGATIVE

## 2019-05-04 MED ORDER — METRONIDAZOLE 500 MG PO TABS: 500.0000 mg | ORAL_TABLET | Freq: Two times a day (BID) | ORAL | 0 refills | Status: AC

## 2019-05-04 NOTE — Progress Notes (Signed)
    STI clinic/screening visit  Subjective:  Katie Vazquez is a 33 y.o. female being seen today for an STI screening visit. The patient reports they do have symptoms.  Patient has the following medical conditions:  There are no active problems to display for this patient.    Chief Complaint  Patient presents with  . SEXUALLY TRANSMITTED DISEASE    HPI  Patient reports vaginal irritation x 1 wk, with increased redness on labia majora-no lesions and increased urinary frequency.  Client feels that vaginal irritation may a little better today.  Client states she uses body soap to clean external genitalia  See flowsheet for further details and programmatic requirements.    The following portions of the patient's history were reviewed and updated as appropriate: allergies, current medications, past medical history, past social history, past surgical history and problem list.  Objective:  There were no vitals filed for this visit.  Physical Exam Constitutional:      Appearance: She is obese.  HENT:     Mouth/Throat:     Mouth: Mucous membranes are moist.     Pharynx: Oropharynx is clear. No oropharyngeal exudate or posterior oropharyngeal erythema.  Neck:     Musculoskeletal: Neck supple. No muscular tenderness.  Abdominal:     General: Abdomen is flat.     Tenderness: There is no abdominal tenderness.  Genitourinary:    Vagina: Vaginal discharge present.     Comments: bilat labia majora erythematous area length of labia, no lesions/abrasions/disch/ swelling. Vagina- thick copious white disch , ph >4.5,  + odor Lymphadenopathy:     Cervical: No cervical adenopathy.  Skin:    General: Skin is warm and dry.     Findings: No lesion or rash.  Neurological:     Mental Status: She is alert.    Assessment and Plan:  Katie Vazquez is a 33 y.o. female presenting to the [facility] Department for STI screening  1.[place] for STD  - WET PREP FOR [place],  YEAST, CLUE - Chlamydia/Gonorrhea [place] [facility] - HIV/HCV [place] [facility] - Hepatitis Serology, [place] [facility] - Syphilis Serology, [facility]  2. Bacterial vaginitis Treat wet prep for BV Metronidazole 500 mg take 1 tablet BID x 7 days. Co. not to be sexually active][until completes meds .  Use condoms for STD prevention   3. Anxiety and Depression Client referred to Milton Ferguson for counseling.  No follow-ups on file.  No future appointments.  Hassell Done, FNP

## 2019-05-04 NOTE — Progress Notes (Signed)
Here today for STD testing. Accepts bloodwork. Hal Morales, RN  Wet Prep results reviewed. Patient treated for BV per standing orders. Hal Morales, RN

## 2019-06-02 ENCOUNTER — Ambulatory Visit: Payer: Medicaid Other | Admitting: Licensed Clinical Social Worker

## 2019-06-16 NOTE — Addendum Note (Signed)
Addended by: Cletis Media on: 06/16/2019 11:13 AM   Modules accepted: Orders

## 2019-07-11 ENCOUNTER — Telehealth: Payer: Self-pay | Admitting: Licensed Clinical Social Worker

## 2019-07-11 NOTE — Telephone Encounter (Signed)
Patient left vm over the weekend for LCSW requesting appt. LCSw returned call on 10/12 and scheduled patent for an appointment.

## 2019-07-18 ENCOUNTER — Telehealth: Payer: Self-pay | Admitting: Licensed Clinical Social Worker

## 2019-07-18 NOTE — Telephone Encounter (Signed)
Patient left vm requesting a phone call on 07/15/2019. LCSW returned call. LCSW provided information on treatment options, including calling [facility], [place] which patient declined; and discussed [facility] as a potential option.

## 2019-07-26 ENCOUNTER — Encounter: Payer: Self-pay | Admitting: Licensed Clinical Social Worker

## 2019-07-26 ENCOUNTER — Other Ambulatory Visit: Payer: Self-pay

## 2019-07-26 ENCOUNTER — Ambulatory Visit: Payer: Medicaid Other | Admitting: Licensed Clinical Social Worker

## 2019-07-26 DIAGNOSIS — F4325 Adjustment disorder with mixed disturbance of emotions and conduct: Secondary | ICD-10-CM
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
## 2019-08-01 ENCOUNTER — Ambulatory Visit: Payer: Medicaid Other | Admitting: Licensed Clinical Social Worker

## 2019-08-02 ENCOUNTER — Ambulatory Visit: Payer: Medicaid Other | Admitting: Licensed Clinical Social Worker

## 2019-08-02 DIAGNOSIS — F4325 Adjustment disorder with mixed disturbance of emotions and conduct: Secondary | ICD-10-CM

## 2019-08-02 NOTE — Progress Notes (Signed)
Counselor/Therapist Progress Note  Patient ID: Katie Vazquez, MRN: [id number],    Date: 08/02/2019  Time Spent: Patient was late and we experienced technical difficulties 35 minutes    Treatment Type: Psychotherapy  Reported Symptoms: Verbal aggression and irritability, anger   Mental Status Exam:   Appearance:   Casual     Behavior:  Appropriate and Sharing  Motor:  Normal  Speech/Language:   Normal Rate  Affect:  Appropriate  Mood:  irritable  Thought process:  normal  Thought content:    WNL  Sensory/Perceptual disturbances:    WNL  Orientation:  oriented to person, place, time/date and situation  Attention:  Good  Concentration:  Good  Memory:  WNL  Fund of knowledge:   Good  Insight:    Good  Judgment:   Good  Impulse Control:  Good   Risk Assessment: Danger to Self:  No Self-injurious Behavior: No Danger to Others: No Duty to Warn:no Physical Aggression / Violence:No  Access to Firearms a concern: No  Gang Involvement:No   Subjective: Patient was engaged and cooperative with redirection throughout the session using time effectively to discuss previous session and treatment plan. Patient voices continued motivation for treatment and understanding of CBTs and its use with anger issues. Patient is likely to benefit from future treatment because she remains motivated to decrease symptoms and improve functioning.     Interventions: Cognitive Behavioral Therapy Established psychological safety. Checked in with patient regarding current symptoms and psychosocial stressors. Explored patient's experience of anger since last session, identifying points of intervention and charting out these experiences using CBTs.  Reviewed previous session, including assessment and goal of treatment. Worked collaboratively to develop CBTs treatment plan. Encouraged patient to notice her thoughts, emotions, and behaviors as practice task. Provided support through active listening,  validation of feelings, and highlighted patient's strengths.  Diagnosis:   ICD-10-CM   1. 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 Treatment Target: Understand the relationship between thoughts, emotions, and behaviors  - Psychoeducation on CBT model   - Teach the connection between thoughts, emotions, and behaviors   Treatment Target: Continue to understand mood and anxiety symptoms  - Continue to assess anger, depression and anxiety  - Thought tracking, Behavior tracking  - Identify thoughts, emotions and behaviors that are problematic   Treatment Target: Increase realistic balanced thinking  - Explore patient's thoughts, beliefs, automatic thoughts, assumptions  - Identify and replace thinking that leads to mood disturbance  - Process distress/trauma, identify "stuck" points and allow for emotional release  - Cognitive reframing  - Questioning and challenging thoughts - Identify and modify underlying beliefs  Treatment Target: Reducing vulnerability to "emotional mind" - Introduce Mindfulness  - Teach mindfulness-  focus on awareness of thoughts and feelings without attachment or judgment - Teach distress tolerance techniques - "what helps me" - Values clarification   - Self-care - nutrition, sleep, exercise  - Increase positive events  o Activity planning   Future Appointments  Date Time Provider [place]  08/15/2019  2:00 PM Milton Ferguson, LCSW [facility] None   Interpreter used: NA  Milton Ferguson, LCSW

## 2019-08-15 ENCOUNTER — Ambulatory Visit: Payer: Medicaid Other | Admitting: Licensed Clinical Social Worker

## 2019-08-15 DIAGNOSIS — F4325 Adjustment disorder with mixed disturbance of emotions and conduct: Secondary | ICD-10-CM

## 2019-08-15 NOTE — Progress Notes (Signed)
Counselor/Therapist Progress Note  Patient ID: Monserrat Vidaurri, MRN: [id number],    Date: 08/15/2019  Time Spent: 50 minutes   Treatment Type: Psychotherapy  Reported Symptoms: Obsessive thinking and anxious thoughts, irritability; anger outburst(s)  Mental Status Exam:   Appearance:   Casual     Behavior:  Appropriate, Sharing and Passive-Aggressive  Motor:  Normal  Speech/Language:   Normal Rate  Affect:  Appropriate  Mood:  irritable  Thought process:  normal  Thought content:    WNL  Sensory/Perceptual disturbances:    WNL  Orientation:  oriented to person, place, time/date and situation  Attention:  Good  Concentration:  Good  Memory:  WNL  Fund of knowledge:   Good  Insight:    Good  Judgment:   Good  Impulse Control:  Good   Risk Assessment: Danger to Self:  No Self-injurious Behavior: No Danger to Others: No Duty to Warn:no Physical Aggression / Violence:No  Access to Firearms a concern: No  Gang Involvement:No   Subjective: Patient was engaged and cooperative throughout the session (requireing redirection) using time to discuss thoughts and feelings. Patient voices agreement with treatment plan and understanding of mood and anxiety issues, as well as understanding of CBTs. Patient is likely to benefit from future treatment because she remains motivated to decrease symptoms and improve functioning.   Interventions: Cognitive Behavioral Therapy  Established psychological safety. Provided supportive space encouraging emotional release and processing of current psychosocial stressors, continued mood instability due to multiple stressors. Continued to assess patient's mood symptoms and discussed diagnosis and treatment plan. Charted out patient's anger outburst, identifying points of intervention, including unhelpful/distorted thought patterns and difficulties regulating emotions. Discussed distress tolerance - using ice cube and walking away. Provided support  through active listening, validation of feelings, and highlighted patient's strengths.    Diagnosis:   ICD-10-CM   1. 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 Treatment Target: Understand the relationship between thoughts, emotions, and behaviors   Psychoeducation on CBT model    Teach the connection between thoughts, emotions, and behaviors   Treatment Target: Continue to understand mood and anxiety symptoms   Continue to assess anger, depression and anxiety   Thought tracking, Behavior tracking   Identify thoughts, emotions and behaviors that are problematic   Treatment Target: Increase realistic balanced thinking   Explore patient's thoughts, beliefs, automatic thoughts, assumptions   Identify and replace thinking that leads to mood disturbance   Process distress/trauma, identify "stuck" points and allow for emotional release   Cognitive reframing   Questioning and challenging thoughts  Identify and modify underlying beliefs  Treatment Target: Reducing vulnerability to "emotional mind"  Introduce Mindfulness   Teach mindfulness- focus on awareness of thoughts and feelings without attachment or judgment  Teach distress tolerance techniques - "what helps me"  Values clarification    Self-care - nutrition, sleep, exercise   Increase positive events   Activity planning   Interpreter used: NA   Milton Ferguson, LCSW

## 2019-08-22 ENCOUNTER — Ambulatory Visit: Payer: Medicaid Other | Admitting: Licensed Clinical Social Worker

## 2019-08-22 DIAGNOSIS — F4325 Adjustment disorder with mixed disturbance of emotions and conduct: Secondary | ICD-10-CM

## 2019-08-22 NOTE — Progress Notes (Signed)
Counselor/Therapist Progress Note  Patient ID: Katie Vazquez, MRN: [id number],    Date: 08/22/2019  Time Spent: 54 minutes   Treatment Type: Psychotherapy  Reported Symptoms: depressed mood, ruminating thoughts, anxiety, anxious thoughts   Mental Status Exam:  Appearance:   Casual     Behavior:  Appropriate and Sharing  Motor:  Normal  Speech/Language:   Normal Rate  Affect:  Appropriate  Mood:  dysthymic  Thought process:  normal  Thought content:    WNL  Sensory/Perceptual disturbances:    WNL  Orientation:  oriented to person, place, time/date and situation  Attention:  Good  Concentration:  Good  Memory:  WNL  Fund of knowledge:   Good  Insight:    Good  Judgment:   Good  Impulse Control:  Good   Risk Assessment: Danger to Self:  No Self-injurious Behavior: No Danger to Others: No Duty to Warn:no Physical Aggression / Violence:No  Access to Firearms a concern: No  Gang Involvement:No   Subjective: Patient was engaged and cooperative throughout the session using time effectively to discuss thoughts and feelings. Patient voices continued motivation, agreement with treatment plan and understanding of mood and anxiety issues. Patient is likely to benefit from future treatment because she remains motivated to decrease symptoms and reports benefit of regular sessions in addressing these symptoms.    Interventions: Cognitive Behavioral Therapy  Established psychological safety. Provided supportive space encouraging emotional release and processing of current psychosocial stressors depressed mood and anxiety due to multiple stressors - breakup of relationship and financial stressors. Validated patient's feelings, highlighting patients anxious and depressed thoughts encouraging patient to notice these thoughts. Reviewed treatment plan. Provided support through active listening, validation of feelings, and highlighted patient's strengths.   Diagnosis:   ICD-10-CM   1.  Adjustment disorder with mixed disturbance of emotions and conduct  F43.25     Plan: Provide psychoeducation on values / values exercise   Patient's goal is to address anger issues. Treatment Target: Understand the relationship between thoughts, emotions, and behaviors   Psychoeducation on CBT model   Teach the connection between thoughts, emotions, and behaviors   Treatment Target: Continue to understand mood and anxiety symptoms  Continue to assess anger, depression and anxiety   Thought tracking,Behavior tracking   Identify thoughts, emotions and behaviors that are problematic   Treatment Target: Increase realistic balanced thinking   Explore patient's thoughts, beliefs, automatic thoughts, assumptions   Identify and replace thinking that leads tomood disturbance  Process distress/trauma, identify "stuck"points and allow for emotional release   Cognitive reframing   Questioning and challenging thoughts  Identify and modify underlying beliefs  Treatment Target: Reducing vulnerability to "emotional mind"  Introduce Mindfulness   Teach mindfulness- focus on awareness of thoughts and feelings without attachment or judgment  Teach distress tolerance techniques -"what helps me"  Values clarification   Self-care -nutrition, sleep, exercise   Increase positive events   Activity planning  Interpreter used: NA   Milton Ferguson, LCSW

## 2019-08-29 ENCOUNTER — Ambulatory Visit: Payer: Medicaid Other | Admitting: Licensed Clinical Social Worker

## 2019-08-29 DIAGNOSIS — F4325 Adjustment disorder with mixed disturbance of emotions and conduct: Secondary | ICD-10-CM

## 2019-08-29 NOTE — Progress Notes (Signed)
Counselor/Therapist Progress Note  Patient ID: Katie Vazquez, MRN: [id number],    Date: 08/29/2019  Time Spent: 55 minutes   Treatment Type: Psychotherapy  Reported Symptoms: Obsessive thinking, Anhedonia and depressed mood, anxiety, anxious thoughts   Mental Status Exam:  Appearance:   Casual     Behavior:  Appropriate and Sharing  Motor:  Normal  Speech/Language:   Normal Rate  Affect:  Appropriate  Mood:  normal  Thought process:  normal  Thought content:    WNL  Sensory/Perceptual disturbances:    WNL  Orientation:  oriented to person, place, time/date and situation  Attention:  Good  Concentration:  Good  Memory:  WNL  Fund of knowledge:   Good  Insight:    Good  Judgment:   Good  Impulse Control:  Fair   Risk Assessment: Danger to Self:  No Self-injurious Behavior: No Danger to Others: No Duty to Warn:no Physical Aggression / Violence:No  Access to Firearms a concern: No  Gang Involvement:No   Subjective: Patient was engaged and cooperative throughout the session using time effectively to discuss thoughts and feelings. Patient voices continued motivation for treatment and understanding of mood instabiity and anxiety issues. Patient is likely to benefit from future treatment because she remains motivated to decrease symptoms and improve functioning and and reports benefit of regular sessions.   Interventions: Cognitive Behavioral Therapy  Established psychological safety.  Engaged patient in processing current psychosocial stressors - continued mood instability and anxiousness due to unresolved childhood issues and current relationship challenges. Social worker provider emotional support, validation and normalization, empathy, and discussed unhelpful thinking styles, specifically Magnification, Jumping to conclusions, and Emotional Reasoning. Encouraged patient to notice self-talk and to seek support from positive peers.   Diagnosis:   ICD-10-CM   1.  Adjustment disorder with mixed disturbance of emotions and conduct  F43.25     Plan: Review Unhelpful Thought Styles. Introduce Mindfulness.   Patient's goal is to address anger issues. Treatment Target: Understand the relationship between thoughts, emotions, and behaviors   Psychoeducation on CBT model   Teach the connection between thoughts, emotions, and behaviors   Treatment Target: Continue to understand mood and anxiety symptoms  Continue to assess anger, depression and anxiety   Thought tracking,Behavior tracking   Identify thoughts, emotions and behaviors that are problematic   Treatment Target: Increase realistic balanced thinking   Explore patient's thoughts, beliefs, automatic thoughts, assumptions   Identify and replace thinking that leads tomood disturbance  Process distress/trauma, identify "stuck"points and allow for emotional release   Cognitive reframing   Questioning and challenging thoughts  Identify and modify underlying beliefs  Treatment Target: Reducing vulnerability to "emotional mind"  Introduce Mindfulness   Teach mindfulness- focus on awareness of thoughts and feelings without attachment or judgment  Teach distress tolerance techniques -"what helps me"  Values clarification   Self-care -nutrition, sleep, exercise   Increase positive events - Activity planning  Future Appointments  Date Time Provider [place]  09/05/2019  1:00 PM Milton Ferguson, LCSW [facility] None     Interpreter used: NA   Milton Ferguson, LCSW

## 2019-09-05 ENCOUNTER — Ambulatory Visit: Payer: Medicaid Other | Admitting: Licensed Clinical Social Worker

## 2019-09-05 DIAGNOSIS — F4325 Adjustment disorder with mixed disturbance of emotions and conduct: Secondary | ICD-10-CM

## 2019-09-05 NOTE — Progress Notes (Signed)
Counselor/Therapist Progress Note  Patient ID: Lennyx Verdell, MRN: [id number],    Date: 09/05/2019  Time Spent: 50 minutes   Treatment Type: Individual Therapy  Reported Symptoms: Obsessive thinking, Anhedonia and mood instability -depressed low mood, anxiety, anxious thought patterns   Mental Status Exam:   Appearance:   Casual     Behavior:  Appropriate and Sharing  Motor:  Normal  Speech/Language:   Normal Rate  Affect:  Appropriate, Congruent and Tearful  Mood:  dysthymic  Thought process:  normal  Thought content:    WNL  Sensory/Perceptual disturbances:    WNL  Orientation:  oriented to person, place, time/date and situation  Attention:  Good  Concentration:  Good  Memory:  WNL  Fund of knowledge:   Good  Insight:    Good  Judgment:   Good  Impulse Control:  Good   Risk Assessment: Danger to Self:  No Self-injurious Behavior: No Danger to Others: No Duty to Warn:no Physical Aggression / Violence:No  Access to Firearms a concern: No  Gang Involvement:No   Subjective: Patient was engaged and cooperative, requiring some redirection to stay on task. Patient used session to discuss thoughts and feelings. Patient voices continued motivation for treatment and understanding of mood and anxiety issues. Patient is likely to benefit from future treatment because she remains motivated to decrease mood instability and reports benefit of regular sessions in addressing these symptoms.    Interventions: Cognitive Behavioral Therapy and Mindfulness Meditation  Established psychological safety. Checked in with patient, engaging patient in processing current psychosocial stressors - continued mood instability due to financial stressors and recent break-up. Engaged patient in breathing exercise to re-focus patient. Re-set session agenda.  Explored patient's perception of stressors, highlighting thoughts and reframeing unhelpful thoughts leading to distress. Encouraged patient to  continue to seek family and social support; and to practice breathing exercises. Provided information about [facility]. Provided support through active listening, validation of feelings, and highlighted patient's strengths.   Diagnosis:   ICD-10-CM   1. Adjustment disorder with mixed disturbance of emotions and conduct  F43.25     Plan: Need to circle back to Review Unhelpful Thought Styles. And teach patient abut Mindfulness.   Patient's goal is to address anger issues. Treatment Target: Understand the relationship between thoughts, emotions, and behaviors   Psychoeducation on CBT model   Teach the connection between thoughts, emotions, and behaviors   Treatment Target: Continue to understand mood and anxiety symptoms  Continue to assess anger, depression and anxiety   Thought tracking,Behavior tracking   Identify thoughts, emotions and behaviors that are problematic   Treatment Target: Increase realistic balanced thinking   Explore patient's thoughts, beliefs, automatic thoughts, assumptions   Identify and replace thinking that leads tomood disturbance  Process distress/trauma, identify "stuck"points and allow for emotional release   Cognitive reframing   Questioning and challenging thoughts  Identify and modify underlying beliefs  Treatment Target: Reducing vulnerability to "emotional mind"  Introduce Mindfulness   Teach mindfulness- focus on awareness of thoughts and feelings without attachment or judgment  Teach distress tolerance techniques -"what helps me"  Values clarification   Self-care -nutrition, sleep, exercise                                  Increase positive events - Activity planning  Future Appointments  Date Time Provider [place]  09/07/2019  4:00 PM Milton Ferguson, LCSW [facility] None  09/12/2019  1:00 PM Kathreen Cosier, LCSW [facility] None     Interpreter used: NA   Kathreen Cosier, LCSW

## 2019-09-07 ENCOUNTER — Ambulatory Visit: Payer: Medicaid Other | Admitting: Licensed Clinical Social Worker

## 2019-09-07 DIAGNOSIS — F4325 Adjustment disorder with mixed disturbance of emotions and conduct: Secondary | ICD-10-CM

## 2019-09-07 NOTE — Progress Notes (Signed)
Counselor/Therapist Progress Note  Patient ID: Katie Vazquez, MRN: [id number],    Date: 09/07/2019  Time Spent: 1 hour   Treatment Type: Individual Therapy  Reported Symptoms: Feelings of Worthlessness, Hopelessness, Obsessive thinking, Anhedonia, Sleep disturbance, Appetite disturbance, Fatigue and depressed mood; anxiety, anxious thoughts   Mental Status Exam:  Appearance:   Casual     Behavior:  Appropriate and Sharing  Motor:  Normal  Speech/Language:   Normal Rate  Affect:  Appropriate and Depressed  Mood:  depressed  Thought process:  circumstantial  Thought content:    WNL  Sensory/Perceptual disturbances:    WNL  Orientation:  oriented to person, place, time/date and situation  Attention:  Good  Concentration:  Good  Memory:  WNL  Fund of knowledge:   Good  Insight:    Good  Judgment:   Good  Impulse Control:  Good   Risk Assessment: Danger to Self:  No Self-injurious Behavior: No Danger to Others: No Duty to Warn:no Physical Aggression / Violence:No  Access to Firearms a concern: No  Gang Involvement:No   Subjective: Patient required re-direction but was engaged and cooperative throughout the session using time effectively to discuss thoughts and feelings. Patient voices continued motivation for treatment and understanding of depression and anxiety symptoms. Patient is likely to benefit from future treatment because she remains motivated to decrease symptoms of depression and anxiety and reports benefit of regular sessions in addressing these symptoms.    Interventions: Cognitive Behavioral Therapy  Established psychological safety. Checked in with patient.  Engaged patient in processing current psychosocial stressors - continued patterns of depression/anxiety; recent break-up and financial stressors. Engaged patient in discussing her openness to med management evaluation - and discussed patient being [facility] at [facility]. Provided Psychoeducation on mindfulness,  engaged patient in mindfulness exercise, processed exercise, and contracted with patient to complete daily. Provided support through active listening, validation of feelings, and highlighted patient's strengths.    Diagnosis:   ICD-10-CM   1. Adjustment disorder with mixed disturbance of emotions and conduct  F43.25     Plan: Need to circle back to Review Unhelpful Thought Styles.   Follow up on mindfulness one minute counting breaths exercise.   Patient's goal is to address anger issues. Treatment Target: Understand the relationship between thoughts, emotions, and behaviors   Psychoeducation on CBT model   Teach the connection between thoughts, emotions, and behaviors   Treatment Target: Continue to understand mood and anxiety symptoms  Continue to assess anger, depression and anxiety   Thought tracking,Behavior tracking   Identify thoughts, emotions and behaviors that are problematic   Treatment Target: Increase realistic balanced thinking   Explore patient's thoughts, beliefs, automatic thoughts, assumptions   Identify and replace thinking that leads tomood disturbance  Process distress/trauma, identify "stuck"points and allow for emotional release   Cognitive reframing   Questioning and challenging thoughts  Identify and modify underlying beliefs  Treatment Target: Reducing vulnerability to "emotional mind"  Introduce Mindfulness   Teach mindfulness- focus on awareness of thoughts and feelings without attachment or judgment  Teach distress tolerance techniques -"what helps me"  Values clarification   Self-care -nutrition, sleep, exercise                                  Increase positive events-Activity planning  Future Appointments  Date Time Provider [facility]  09/12/2019  1:00 PM Kathreen Cosier, LCSW [facility] None  Interpreter used: NA   Milton Ferguson, LCSW

## 2019-09-12 ENCOUNTER — Ambulatory Visit: Payer: Medicaid Other | Admitting: Licensed Clinical Social Worker

## 2019-09-12 DIAGNOSIS — F4325 Adjustment disorder with mixed disturbance of emotions and conduct: Secondary | ICD-10-CM

## 2019-09-12 NOTE — Progress Notes (Signed)
Counselor/Therapist Progress Note  Patient ID: Katie Vazquez, MRN: [id number],    Date: 09/12/2019  Time Spent: 55 minutes   Treatment Type: Individual Therapy  Reported Symptoms: overall decrease in distress - continued low mood, anxiety  Mental Status Exam:  Appearance:   Casual     Behavior:  Appropriate and Sharing  Motor:  Normal  Speech/Language:   Normal Rate  Affect:  Appropriate  Mood:  normal  Thought process:  normal  Thought content:    WNL  Sensory/Perceptual disturbances:    WNL  Orientation:  oriented to person, place, time/date and situation  Attention:  Good  Concentration:  Good  Memory:  WNL  Fund of knowledge:   Good  Insight:    Good  Judgment:   Good  Impulse Control:  Good   Risk Assessment: Danger to Self:  No Self-injurious Behavior: No Danger to Others: No Duty to Warn:no Physical Aggression / Violence:No  Access to Firearms a concern: No  Gang Involvement:No   Subjective: Patient was engaged and cooperative throughout the session using time effectively to discuss thoughts and feelings. Patient voices continued motivation for treatment and understanding of anxiety and depression issues. Patient is likely to benefit from future treatment because she remains motivated to decrease symptoms and reports benefit of regular sessions in addressing symptoms.    Interventions: Cognitive Behavioral Therapy  Established psychological safety. Checked in with patient. Engaged patient in processing current psychosocial stressors, continued depression/anxiety due to relationship challenges with recent ex-boyfriend; history of unresolved issues related to relationship with her father/childhoodtrauma. Provided support through active listening, validation of feelings, and highlighted patient's strengths. Encouraged patient to notice "triggers" of being alone, identifying it, exploring it, and accepting it, noticing self-talk.   Diagnosis:   ICD-10-CM   1.  Adjustment disorder with mixed disturbance of emotions and conduct  F43.25     Plan: Need to circle back toReview Unhelpful Thought Styles.  Follow up on mindfulness one minute counting breaths exercise.   Patient's goal is to address anger issues. Treatment Target: Understand the relationship between thoughts, emotions, and behaviors   Psychoeducation on CBT model   Teach the connection between thoughts, emotions, and behaviors   Treatment Target: Continue to understand mood and anxiety symptoms  Continue to assess anger, depression and anxiety   Thought tracking,Behavior tracking   Identify thoughts, emotions and behaviors that are problematic   Treatment Target: Increase realistic balanced thinking   Explore patient's thoughts, beliefs, automatic thoughts, assumptions   Identify and replace thinking that leads tomood disturbance  Process distress/trauma, identify "stuck"points and allow for emotional release   Cognitive reframing   Questioning and challenging thoughts  Identify and modify underlying beliefs  Treatment Target: Reducing vulnerability to "emotional mind"  Introduce Mindfulness   Teach mindfulness- focus on awareness of thoughts and feelings without attachment or judgment  Teach distress tolerance techniques -"what helps me"  Values clarification   Self-care -nutrition, sleep, exercise  Increase positive events-Activity planning  Future Appointments  Date Time Provider [place]  09/14/2019  4:40 PM Milton Ferguson, LCSW [facility] None     Interpreter used: NA   Milton Ferguson, LCSW

## 2019-09-14 ENCOUNTER — Ambulatory Visit: Payer: Medicaid Other | Admitting: Licensed Clinical Social Worker

## 2019-09-28 ENCOUNTER — Ambulatory Visit: Payer: Medicaid Other | Admitting: Licensed Clinical Social Worker

## 2019-09-28 DIAGNOSIS — F4325 Adjustment disorder with mixed disturbance of emotions and conduct: Secondary | ICD-10-CM

## 2019-09-28 NOTE — Progress Notes (Signed)
Counselor/Therapist Progress Note  Patient ID: Katie Vazquez, MRN: [id number],    Date: 09/28/2019  Time Spent: 45 minutes    Treatment Type: Individual Therapy  Reported Symptoms: Obsessive thinking and anxiety, anxious thoughts, irritability   Mental Status Exam:  Appearance:   Casual     Behavior:  Appropriate and Sharing  Motor:  Normal  Speech/Language:   Normal Rate  Affect:  Appropriate  Mood:  normal  Thought process:  normal  Thought content:    WNL  Sensory/Perceptual disturbances:    WNL  Orientation:  oriented to person, place, time/date and situation  Attention:  Good  Concentration:  Good  Memory:  WNL  Fund of knowledge:   Good  Insight:    Good  Judgment:   Good  Impulse Control:  Good   Risk Assessment: Danger to Self:  No Self-injurious Behavior: No Danger to Others: No Duty to Warn:no Physical Aggression / Violence:No  Access to Firearms a concern: No  Gang Involvement:No   Subjective: Patient was engaged and cooperative throughout the session using time effectively to discuss thoughts and feelings. Patient voices continued motivation for treatment and understanding of depression/anxiety. Patient is likely to benefit from future treatment because she remains motivated to decrease symptoms and improve functioning and reports benefit of regular sessions.   Interventions: Cognitive Behavioral Therapy  Established psychological safety. Provided supportive space encouraging emotional release and processing of current psychosocial stressors - continued depression and anxiety due to multiple stressors, inclduing relationship challenges and financial challenges. Reviewed Unhelpful Thinking Styles, and mindfulness exercises. Encouraged patient to continue reading daily devotionals as she finds benefit from them. Provided support through active listening, validation of feelings, and highlighted patient's strengths.   Diagnosis:   ICD-10-CM   1.  Adjustment disorder with mixed disturbance of emotions and conduct  F43.25     Plan:  Focus on Values - if patient is in agreement   Patient's goal is to address anger issues. Treatment Target: Understand the relationship between thoughts, emotions, and behaviors   Psychoeducation on CBT model   Teach the connection between thoughts, emotions, and behaviors   Treatment Target: Continue to understand mood and anxiety symptoms  Continue to assess anger, depression and anxiety   Thought tracking,Behavior tracking   Identify thoughts, emotions and behaviors that are problematic   Treatment Target: Increase realistic balanced thinking   Explore patient's thoughts, beliefs, automatic thoughts, assumptions   Identify and replace thinking that leads tomood disturbance  Process distress/trauma, identify "stuck"points and allow for emotional release   Cognitive reframing   Questioning and challenging thoughts  Identify and modify underlying beliefs  Treatment Target: Reducing vulnerability to "emotional mind"  Introduce Mindfulness   Teach mindfulness- focus on awareness of thoughts and feelings without attachment or judgment  Teach distress tolerance techniques -"what helps me"  Values clarification   Self-care -nutrition, sleep, exercise  Increase positive events-Activity planning  Future Appointments  Date Time Provider [place]  10/03/2019  1:00 PM Milton Ferguson, LCSW [facility] None    Interpreter used: NA   Milton Ferguson, LCSW

## 2019-10-03 ENCOUNTER — Ambulatory Visit: Payer: Medicaid Other | Admitting: Licensed Clinical Social Worker

## 2019-10-04 ENCOUNTER — Ambulatory Visit: Payer: Medicaid Other | Admitting: Licensed Clinical Social Worker

## 2019-10-04 DIAGNOSIS — F4325 Adjustment disorder with mixed disturbance of emotions and conduct: Secondary | ICD-10-CM

## 2019-10-04 NOTE — Progress Notes (Signed)
Counselor/Therapist Progress Note  Patient ID: Katie Vazquez, MRN: [id number],    Date: 10/04/2019  Time Spent: 30 minutes (patient was late for session)  Treatment Type: Individual Therapy  Reported Symptoms: Obsessive thinking, Anhedonia, Sleep disturbance and depressed mood, stress   Mental Status Exam:  Appearance:   NA     Behavior:  Appropriate and Sharing  Motor:  NA  Speech/Language:   Normal Rate  Affect:  NA  Mood:  anxious, depressed and distress  Thought process:  normal  Thought content:    WNL  Sensory/Perceptual disturbances:    WNL  Orientation:  oriented to person, place, time/date and situation  Attention:  Good  Concentration:  Good  Memory:  WNL  Fund of knowledge:   Good  Insight:    Good  Judgment:   Good  Impulse Control:  Good   Risk Assessment: Danger to Self:  No Self-injurious Behavior: No Danger to Others: No Duty to Warn:no Physical Aggression / Violence:No  Access to Firearms a concern: No  Gang Involvement:No   Subjective: Patient was engaged and cooperative throughout the session using time effectively to discuss thoughts and feelings. Patient voices continued motivation for treatment and understanding of depression and anxiety issues. Patient is likely to benefit from future treatment because she remains motivated to decrease symptoms and improve functioning and reports benefit of regular sessions in addressing symptoms. Patient verbalized decrease in distress by end of the session.  Patient is recommended for med management eval.   Interventions: Cognitive Behavioral Therapy  Established psychological safety. Checked in with patient assessed current crisis and assessed safety. Assisted patient in identifying the major problems/triggers and assisted her in coping with immediate stressors - developing alternative thoughts and a plan to address short term needs. Encouraged patient to reach out to resources- [facility] and supports including  family. Provided support through active listening, validation of feelings, and highlighted patient's strengths. LCSW notes that patient was calmer by the end of the session.   Diagnosis:   ICD-10-CM   1. Adjustment disorder with mixed disturbance of emotions and conduct  F43.25    Plan:  Patient's goal is to address anger issues. Treatment Target: Understand the relationship between thoughts, emotions, and behaviors   Psychoeducation on CBT model   Teach the connection between thoughts, emotions, and behaviors   Treatment Target: Continue to understand mood and anxiety symptoms  Continue to assess anger, depression and anxiety   Thought tracking,Behavior tracking   Identify thoughts, emotions and behaviors that are problematic   Treatment Target: Increase realistic balanced thinking   Explore patient's thoughts, beliefs, automatic thoughts, assumptions   Identify and replace thinking that leads tomood disturbance  Process distress/trauma, identify "stuck"points and allow for emotional release   Cognitive reframing   Questioning and challenging thoughts  Identify and modify underlying beliefs  Treatment Target: Reducing vulnerability to "emotional mind"  Introduce Mindfulness   Teach mindfulness- focus on awareness of thoughts and feelings without attachment or judgment  Teach distress tolerance techniques -"what helps me"  Values clarification   Self-care -nutrition, sleep, exercise   Increase positive events-Activity planning  Future Appointments  Date Time Provider Department Center  10/10/2019  1:00 PM Kathreen Cosier, LCSW [facility] None    Interpreter used: NA   Kathreen Cosier, LCSW

## 2019-10-10 ENCOUNTER — Ambulatory Visit: Payer: Medicaid Other | Admitting: Licensed Clinical Social Worker

## 2019-10-11 ENCOUNTER — Ambulatory Visit: Payer: Medicaid Other | Admitting: Licensed Clinical Social Worker

## 2019-10-18 ENCOUNTER — Telehealth: Payer: Self-pay | Admitting: Licensed Clinical Social Worker

## 2019-10-18 ENCOUNTER — Ambulatory Visit: Payer: Medicaid Other | Admitting: Licensed Clinical Social Worker

## 2019-10-18 NOTE — Telephone Encounter (Signed)
Attempted call to patient due to no show on zoom appointment. LCSW left vm encouraging patient to reschedule appointment.

## 2019-11-03 ENCOUNTER — Ambulatory Visit: Payer: Medicaid Other | Admitting: Licensed Clinical Social Worker

## 2019-11-07 ENCOUNTER — Ambulatory Visit: Payer: Medicaid Other | Admitting: Licensed Clinical Social Worker

## 2019-11-22 ENCOUNTER — Telehealth: Payer: Self-pay | Admitting: Licensed Clinical Social Worker

## 2019-11-22 NOTE — Telephone Encounter (Signed)
pt. requested an appt. via email. LCSW attempted to return call and left vm.

## 2019-11-24 ENCOUNTER — Ambulatory Visit: Payer: Medicaid Other | Admitting: Licensed Clinical Social Worker

## 2019-11-24 DIAGNOSIS — F4325 Adjustment disorder with mixed disturbance of emotions and conduct: Secondary | ICD-10-CM

## 2019-11-24 NOTE — Progress Notes (Signed)
Counselor/Therapist Progress Note  Patient ID: Katie Vazquez, MRN: [id number],    Date: 11/24/2019  Time Spent: 50 minutes   Treatment Type: Individual Therapy  Reported Symptoms: Obsessive thinking, Anhedonia, Sleep disturbance, Appetite disturbance and depressed mood; emotional dysregulation; impulsivity  Mental Status Exam:  Appearance:   Casual     Behavior:  Sharing and Rationalizing  Motor:  Normal  Speech/Language:   Normal Rate  Affect:  Congruent, Depressed and Tearful  Mood:  depressed  Thought process:  normal  Thought content:    WNL  Sensory/Perceptual disturbances:    WNL  Orientation:  oriented to person, place, time/date, situation and day of week  Attention:  Good  Concentration:  Good  Memory:  WNL  Fund of knowledge:   Good  Insight:    Good  Judgment:   Good  Impulse Control:  Good   Risk Assessment: Danger to Self:  No Self-injurious Behavior: No Danger to Others: No  Duty to Warn:no Physical Aggression / Violence:No  Access to Firearms a concern: No  Gang Involvement:No   Subjective: Patient was engaged and cooperative throughout the session using time effectively to discuss thoughts and feelings. Patient voices motivation for treatment and understanding of mood and anxiety issues and the importance with following through with treatment. Patient is likely to benefit from future treatment because she remains motivated to decrease symptoms and improve functioning and reports benefit of regular sessions in addressing these symptoms.   Interventions: Cognitive Behavioral Therapy  Established psychological safety. Provided supportive space encouraging emotional release and processing of current psychosocial stressors continued patterns of depressed mood and anxiety due to multiple stressors - financial stressors; Unemployment; relationship difficulties with ex. Explored patient's perception's of stressors, identifying unhelpful thoughts and reframing  these thoughts leading to distress. Encouraged patient to notice these unhelpful thoughts, encouraging alternative views promoting psychological flexibility. Reviewed the use of deep breathing. Discussed resources. Provided support through active listening, validation of feelings, and highlighted patient's strengths.    Diagnosis:   ICD-10-CM   1. 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 Treatment Target: Understand the relationship between thoughts, emotions, and behaviors   Psychoeducation on CBT model   Teach the connection between thoughts, emotions, and behaviors   Treatment Target: Continue to understand mood and anxiety symptoms  Continue to assess anger, depression and anxiety   Thought tracking,Behavior tracking   Identify thoughts, emotions and behaviors that are problematic   Treatment Target: Increase realistic balanced thinking   Explore patient's thoughts, beliefs, automatic thoughts, assumptions   Identify and replace thinking that leads tomood disturbance  Process distress/trauma, identify "stuck"points and allow for emotional release   Cognitive reframing   Questioning and challenging thoughts  Identify and modify underlying beliefs  Treatment Target: Reducing vulnerability to "emotional mind"  Introduce Mindfulness   Teach mindfulness- focus on awareness of thoughts and feelings without attachment or judgment  Teach distress tolerance techniques -"what helps me"  Values clarification   Self-care -nutrition, sleep, exercise   Increase positive events-Activity planning  Future Appointments  Date Time Provider Department Center  12/01/2019  4:30 PM Kathreen Cosier, LCSW [facility] None    Interpreter used:  na  Kathreen Cosier, LCSW

## 2019-12-01 ENCOUNTER — Ambulatory Visit: Payer: Medicaid Other | Admitting: Licensed Clinical Social Worker

## 2019-12-01 DIAGNOSIS — F4325 Adjustment disorder with mixed disturbance of emotions and conduct: Secondary | ICD-10-CM

## 2019-12-01 NOTE — Progress Notes (Signed)
Counselor/Therapist Progress Note  Patient ID: Katie Vazquez, MRN: [id number],    Date: 12/01/2019  Time Spent: 45 minutes    Treatment Type: Individual Therapy  Reported Symptoms: increased mood stability  Mental Status Exam:  Appearance:   Casual     Behavior:  Appropriate and Sharing  Motor:  Normal  Speech/Language:   Normal Rate  Affect:  Appropriate  Mood:  normal  Thought process:  normal  Thought content:    WNL  Sensory/Perceptual disturbances:    WNL  Orientation:  oriented to person, place, time/date, situation and day of week  Attention:  Good  Concentration:  Good  Memory:  WNL  Fund of knowledge:   Good  Insight:    Good  Judgment:   Good  Impulse Control:  Good   Risk Assessment: Danger to Self:  No Self-injurious Behavior: No Danger to Others: No Duty to Warn:no Physical Aggression / Violence:No  Access to Firearms a concern: No  Gang Involvement:No   Subjective: Patient was engaged and cooperative throughout the session using time effectively to discuss thoughts and feelings. Patient voices continued motivation for treatment and understanding of mood and anxiety issues. Patient is likely to benefit from future treatment because she is motivated to decrease symptoms and improve functioning and reports benefit of regular sessions in addressing these symptoms.    Interventions: Cognitive Behavioral Therapy  Established psychological safety. Checked in with patient. Set session agenda. Engaged patient in exploring options to engage in meaningful activities. Discussed patient engaging in physical activity/exercise and cooking. Taught patient strategies to identify and not respond to strong emotions, including noticing the emotion, identifying the emotion and breathing, walking away if needed and not responding for a day; noticing our thoughts- our negative thoughts and checking in with these thoughts after some time has past. Provided support through active  listening, validation of feelings, and highlighted patient's strengths.   Diagnosis:   ICD-10-CM   1. 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 Treatment Target: Understand the relationship between thoughts, emotions, and behaviors   Psychoeducation on CBT model   Teach the connection between thoughts, emotions, and behaviors   Treatment Target: Continue to understand mood and anxiety symptoms  Continue to assess anger, depression and anxiety   Thought tracking,Behavior tracking   Identify thoughts, emotions and behaviors that are problematic   Treatment Target: Increase realistic balanced thinking   Explore patient's thoughts, beliefs, automatic thoughts, assumptions   Identify and replace thinking that leads tomood disturbance  Process distress/trauma, identify "stuck"points and allow for emotional release   Cognitive reframing   Questioning and challenging thoughts  Identify and modify underlying beliefs  Treatment Target: Reducing vulnerability to "emotional mind"  Introduce Mindfulness   Teach mindfulness- focus on awareness of thoughts and feelings without attachment or judgment  Teach distress tolerance techniques -"what helps me"  Values clarification   Self-care -nutrition, sleep, exercise   Increase positive events-Activity planning   Future Appointments  Date Time Provider Department Center  12/19/2019  3:00 PM Kathreen Cosier, LCSW [facility] None    Interpreter used: NA  Kathreen Cosier, LCSW

## 2019-12-19 ENCOUNTER — Ambulatory Visit: Payer: Medicaid Other | Admitting: Licensed Clinical Social Worker

## 2020-01-03 ENCOUNTER — Ambulatory Visit: Payer: Medicaid Other | Admitting: Licensed Clinical Social Worker

## 2020-01-03 DIAGNOSIS — F4325 Adjustment disorder with mixed disturbance of emotions and conduct: Secondary | ICD-10-CM

## 2020-01-03 NOTE — Progress Notes (Signed)
Counselor/Therapist Progress Note  Patient ID: Katie Vazquez, MRN: [id number],    Date: 01/03/2020  Time Spent: 46 minutes  Treatment Type: Individual Therapy  Reported Symptoms: Obsessive thinking, Verbal aggression and mood instability; worries and anxiety  Mental Status Exam:   Appearance:   NA     Behavior:  Appropriate and Rationalizing  Motor:  NA  Speech/Language:   Normal Rate  Affect:  NA  Mood:  normal  Thought process:  normal  Thought content:    WNL  Sensory/Perceptual disturbances:    WNL  Orientation:  oriented to person, place, time/date and situation  Attention:  Good  Concentration:  Good  Memory:  WNL  Fund of knowledge:   Good  Insight:    Good  Judgment:   Good  Impulse Control:  Good   Risk Assessment: Danger to Self:  No Self-injurious Behavior: No Danger to Others: No Duty to Warn:no Physical Aggression / Violence:No  Access to Firearms a concern: No  Gang Involvement:No   Subjective: Patient was engaged and cooperative throughout the session using time effectively to discuss thoughts and feelings. Patient voices continued motivation for treatment and understanding of  mood and anxiety issues related to multiple stressors. Patient is likely to benefit from future treatment because she voices continued motivation and reports benefit of regular sessions in addressing treatment goal.    Interventions: Cognitive Behavioral Therapy  Established psychological safety. Checked in with patient conducting brief assessment of current psychosocial stressors and symptoms, continued mood and anxiety symptoms; relationship challenges, financial stressors. Provided supportive space encouraging emotional release and processing of current psychosocial stressors, highlighted patient's unhelpful thinking leading to irritability, challenging these thoughts. Discussed the importance of self-care and balancing of personal values. Provided support through active  listening, validation of feelings, and highlighted patient's strengths.    Diagnosis:   ICD-10-CM   1. 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 Treatment Target: Understand the relationship between thoughts, emotions, and behaviors   Psychoeducation on CBT model   Teach the connection between thoughts, emotions, and behaviors   Treatment Target: Continue to understand mood and anxiety symptoms  Continue to assess anger, depression and anxiety   Thought tracking,Behavior tracking   Identify thoughts, emotions and behaviors that are problematic   Treatment Target: Increase realistic balanced thinking   Explore patient's thoughts, beliefs, automatic thoughts, assumptions   Identify and replace thinking that leads tomood disturbance  Process distress/trauma, identify "stuck"points and allow for emotional release   Cognitive reframing   Questioning and challenging thoughts  Identify and modify underlying beliefs  Treatment Target: Reducing vulnerability to "emotional mind"  Introduce Mindfulness   Teach mindfulness- focus on awareness of thoughts and feelings without attachment or judgment  Teach distress tolerance techniques -"what helps me"  Values clarification   Self-care -nutrition, sleep, exercise   Increase positive events-Activity planning  Future Appointments  Date Time Provider Department Center  01/10/2020 10:00 AM Kathreen Cosier, LCSW [facility] None    Interpreter used: NA  Kathreen Cosier, LCSW

## 2020-01-10 ENCOUNTER — Ambulatory Visit: Payer: Medicaid Other | Admitting: Licensed Clinical Social Worker

## 2020-01-10 DIAGNOSIS — F4325 Adjustment disorder with mixed disturbance of emotions and conduct: Secondary | ICD-10-CM

## 2020-01-10 NOTE — Progress Notes (Signed)
Counselor/Therapist Progress Note  Patient ID: Katie Vazquez, MRN: [id number],    Date: 01/10/2020  Time Spent: 47 minutes   Treatment Type: Individual Therapy  Reported Symptoms: continued mild mood instability- low mood, emotional distress, anxious thoughts, irritability; denies anger outbursts for several months   Mental Status Exam:   Appearance:   NA     Behavior:  Appropriate and Sharing  Motor:  NA  Speech/Language:   Normal Rate  Affect:  NA  Mood:  normal  Thought process:  normal  Thought content:    WNL  Sensory/Perceptual disturbances:    WNL  Orientation:  oriented to person, place, time/date and situation  Attention:  Good  Concentration:  Good  Memory:  WNL  Fund of knowledge:   Good  Insight:    Good  Judgment:   Good  Impulse Control:  Good   Risk Assessment: Danger to Self:  No Self-injurious Behavior: No Danger to Others: No Duty to Warn:no Physical Aggression / Violence:No  Access to Firearms a concern: No  Gang Involvement:No   Subjective: Patient was engaged and cooperative throughout the session using time effectively to discuss thoughts and feelings. Patient voices continued motivation for treatment and increased understanding of mood and anxiety issues. Patient is likely to benefit from future treatment because she remains motivated to decrease symptoms and improve functioning and reports benefit of regular sessions.     Interventions: Cognitive Behavioral Therapy  Established psychological safety. Checked in with patient. Provided supportive space encouraging emotional release and processing of current psychosocial stressors, continued anxiety symptoms and low mood due to multiple stressors. Provided support through active listening, validation of feelings, and highlighted patient's strengths.   Diagnosis:   ICD-10-CM   1. Adjustment disorder with mixed disturbance of emotions and conduct  F43.25     Plan: Patient's goal is to address  anger issues. Treatment Target: Understand the relationship between thoughts, emotions, and behaviors   Psychoeducation on CBT model   Teach the connection between thoughts, emotions, and behaviors   Treatment Target: Continue to understand mood and anxiety symptoms  Continue to assess anger, depression and anxiety   Thought tracking,Behavior tracking   Identify thoughts, emotions and behaviors that are problematic   Treatment Target: Increase realistic balanced thinking   Explore patient's thoughts, beliefs, automatic thoughts, assumptions   Identify and replace thinking that leads tomood disturbance  Process distress/trauma, identify "stuck"points and allow for emotional release   Cognitive reframing   Questioning and challenging thoughts  Identify and modify underlying beliefs  Treatment Target: Reducing vulnerability to "emotional mind"  Introduce Mindfulness   Teach mindfulness- focus on awareness of thoughts and feelings without attachment or judgment  Teach distress tolerance techniques -"what helps me"  Values clarification   Self-care -nutrition, sleep, exercise   Increase positive events-Activity planning  Future Appointments  Date Time Provider Department Center  01/26/2020 10:00 AM Kathreen Cosier, LCSW [facility] None    Interpreter used: NA   Kathreen Cosier, LCSW

## 2020-01-26 ENCOUNTER — Ambulatory Visit: Payer: Medicaid Other | Admitting: Licensed Clinical Social Worker
